# Patient Record
Sex: Female | Born: 1960 | Race: White | Hispanic: No | Marital: Married | Smoking: Never smoker
Health system: Southern US, Community
[De-identification: ages and names within clinical notes are randomized; demographics above are authoritative.]

## PROBLEM LIST (undated history)

## (undated) DIAGNOSIS — K610 Anal abscess: Secondary | ICD-10-CM

## (undated) DIAGNOSIS — K5909 Other constipation: Secondary | ICD-10-CM

## (undated) DIAGNOSIS — G43909 Migraine, unspecified, not intractable, without status migrainosus: Secondary | ICD-10-CM

## (undated) DIAGNOSIS — K649 Unspecified hemorrhoids: Secondary | ICD-10-CM

## (undated) DIAGNOSIS — Z973 Presence of spectacles and contact lenses: Secondary | ICD-10-CM

## (undated) DIAGNOSIS — Z8489 Family history of other specified conditions: Secondary | ICD-10-CM

## (undated) DIAGNOSIS — R51 Headache: Secondary | ICD-10-CM

## (undated) DIAGNOSIS — A6920 Lyme disease, unspecified: Secondary | ICD-10-CM

## (undated) HISTORY — PX: TONSILLECTOMY: SUR1361

## (undated) HISTORY — DX: Headache: R51

## (undated) HISTORY — PX: APPENDECTOMY: SHX54

## (undated) HISTORY — DX: Lyme disease, unspecified: A69.20

## (undated) HISTORY — PX: BREAST BIOPSY: SHX20

---

## 1988-01-30 HISTORY — PX: APPENDECTOMY: SHX54

## 2004-01-30 DIAGNOSIS — A6920 Lyme disease, unspecified: Secondary | ICD-10-CM

## 2004-01-30 DIAGNOSIS — Z8619 Personal history of other infectious and parasitic diseases: Secondary | ICD-10-CM

## 2004-01-30 HISTORY — DX: Lyme disease, unspecified: A69.20

## 2004-01-30 HISTORY — DX: Personal history of other infectious and parasitic diseases: Z86.19

## 2004-12-25 ENCOUNTER — Ambulatory Visit: Payer: Self-pay | Admitting: Family Medicine

## 2005-02-27 ENCOUNTER — Other Ambulatory Visit: Admission: RE | Admit: 2005-02-27 | Discharge: 2005-02-27 | Payer: Self-pay | Admitting: Obstetrics and Gynecology

## 2007-08-08 ENCOUNTER — Encounter (INDEPENDENT_AMBULATORY_CARE_PROVIDER_SITE_OTHER): Payer: Self-pay | Admitting: *Deleted

## 2007-08-08 HISTORY — PX: COLONOSCOPY: SHX174

## 2009-01-11 ENCOUNTER — Ambulatory Visit: Payer: Self-pay | Admitting: Family Medicine

## 2009-01-11 DIAGNOSIS — J019 Acute sinusitis, unspecified: Secondary | ICD-10-CM

## 2009-01-11 DIAGNOSIS — R519 Headache, unspecified: Secondary | ICD-10-CM | POA: Insufficient documentation

## 2009-01-11 DIAGNOSIS — R51 Headache: Secondary | ICD-10-CM

## 2009-01-11 DIAGNOSIS — H6690 Otitis media, unspecified, unspecified ear: Secondary | ICD-10-CM | POA: Insufficient documentation

## 2009-04-08 ENCOUNTER — Encounter: Admission: RE | Admit: 2009-04-08 | Discharge: 2009-04-08 | Payer: Self-pay | Admitting: Obstetrics and Gynecology

## 2009-04-15 ENCOUNTER — Encounter: Admission: RE | Admit: 2009-04-15 | Discharge: 2009-04-15 | Payer: Self-pay | Admitting: Obstetrics and Gynecology

## 2010-04-11 ENCOUNTER — Other Ambulatory Visit: Payer: Self-pay | Admitting: Obstetrics and Gynecology

## 2010-04-11 DIAGNOSIS — R928 Other abnormal and inconclusive findings on diagnostic imaging of breast: Secondary | ICD-10-CM

## 2010-04-14 ENCOUNTER — Ambulatory Visit
Admission: RE | Admit: 2010-04-14 | Discharge: 2010-04-14 | Disposition: A | Discharge status: Home / Self Care | Payer: BC Managed Care – PPO | Source: Ambulatory Visit | Attending: Obstetrics and Gynecology | Admitting: Obstetrics and Gynecology

## 2010-04-14 DIAGNOSIS — R928 Other abnormal and inconclusive findings on diagnostic imaging of breast: Secondary | ICD-10-CM

## 2010-04-18 ENCOUNTER — Encounter: Payer: Self-pay | Admitting: Family Medicine

## 2010-04-24 ENCOUNTER — Other Ambulatory Visit (INDEPENDENT_AMBULATORY_CARE_PROVIDER_SITE_OTHER): Payer: BC Managed Care – PPO | Admitting: Family Medicine

## 2010-04-24 DIAGNOSIS — Z0000 Encounter for general adult medical examination without abnormal findings: Secondary | ICD-10-CM

## 2010-04-24 LAB — CBC WITH DIFFERENTIAL/PLATELET
Basophils Absolute: 0 10*3/uL (ref 0.0–0.1)
Eosinophils Absolute: 0.1 10*3/uL (ref 0.0–0.7)
Eosinophils Relative: 1 % (ref 0.0–5.0)
HCT: 37.9 % (ref 36.0–46.0)
Hemoglobin: 12.9 g/dL (ref 12.0–15.0)
Lymphs Abs: 0.9 10*3/uL (ref 0.7–4.0)
MCV: 87.1 fl (ref 78.0–100.0)
Monocytes Absolute: 0.3 10*3/uL (ref 0.1–1.0)
Monocytes Relative: 6.7 % (ref 3.0–12.0)
Neutro Abs: 3.7 10*3/uL (ref 1.4–7.7)
Platelets: 107 10*3/uL — ABNORMAL LOW (ref 150.0–400.0)
RBC: 4.36 Mil/uL (ref 3.87–5.11)
RDW: 14.7 % — ABNORMAL HIGH (ref 11.5–14.6)

## 2010-04-24 LAB — LIPID PANEL
Cholesterol: 197 mg/dL (ref 0–200)
LDL Cholesterol: 99 mg/dL (ref 0–99)
VLDL: 10.4 mg/dL (ref 0.0–40.0)

## 2010-04-24 LAB — BASIC METABOLIC PANEL
CO2: 26 mEq/L (ref 19–32)
Calcium: 8.7 mg/dL (ref 8.4–10.5)
Chloride: 108 mEq/L (ref 96–112)
Creatinine, Ser: 0.9 mg/dL (ref 0.4–1.2)
Sodium: 138 mEq/L (ref 135–145)

## 2010-04-24 LAB — POCT URINALYSIS DIPSTICK
Glucose, UA: NEGATIVE
Protein, UA: NEGATIVE

## 2010-04-24 LAB — HEPATIC FUNCTION PANEL
Albumin: 4 g/dL (ref 3.5–5.2)
Alkaline Phosphatase: 54 U/L (ref 39–117)
Total Protein: 6.2 g/dL (ref 6.0–8.3)

## 2010-04-25 ENCOUNTER — Telehealth: Payer: Self-pay

## 2010-04-25 NOTE — Telephone Encounter (Signed)
Pt aware,

## 2010-04-25 NOTE — Telephone Encounter (Signed)
Message copied by Madison Hickman on Tue Apr 25, 2010 11:20 AM ------      Message from: Dwaine Deter      Created: Tue Apr 25, 2010  8:32 AM       Normal except for mildly low platelets. We will discuss at the cpx

## 2010-04-25 NOTE — Telephone Encounter (Signed)
Message copied by Madison Hickman on Tue Apr 25, 2010  3:29 PM ------      Message from: Dwaine Deter      Created: Mon Apr 24, 2010 12:36 PM       She has blood in the urine. Ask her if she was on menses then

## 2010-04-25 NOTE — Telephone Encounter (Signed)
Yes pt stated was on menses.

## 2010-05-15 ENCOUNTER — Encounter: Payer: Self-pay | Admitting: Family Medicine

## 2010-05-15 ENCOUNTER — Ambulatory Visit (INDEPENDENT_AMBULATORY_CARE_PROVIDER_SITE_OTHER): Payer: BC Managed Care – PPO | Admitting: Family Medicine

## 2010-05-15 VITALS — BP 120/80 | HR 100 | Ht 68.0 in | Wt 150.0 lb

## 2010-05-15 DIAGNOSIS — Z0000 Encounter for general adult medical examination without abnormal findings: Secondary | ICD-10-CM

## 2010-05-15 NOTE — Progress Notes (Signed)
  Subjective:    Patient ID: Jordan Olson, female    DOB: 02/06/1960, 50 y.o.   MRN: [id number]  HPI 50 yr old female for a cpx. She is doing well except for some occasional pain in the hands and some mild hearing loss. It is hard for her to hear conversations in a crowded room. Advil helps the pain well.    Review of Systems  Constitutional: Negative.   HENT: Negative.   Eyes: Negative.   Respiratory: Negative.   Cardiovascular: Negative.   Gastrointestinal: Negative.   Genitourinary: Negative for dysuria, urgency, frequency, hematuria, flank pain, decreased urine volume, enuresis, difficulty urinating, pelvic pain and dyspareunia.  Musculoskeletal: Negative.   Skin: Negative.   Neurological: Negative.   Hematological: Negative.   Psychiatric/Behavioral: Negative.        Objective:   Physical Exam  Constitutional: She is oriented to person, place, and time. She appears well-developed and well-nourished. No distress.  HENT:  Head: Normocephalic and atraumatic.  Right Ear: External ear normal.  Left Ear: External ear normal.  Nose: Nose normal.  Mouth/Throat: Oropharynx is clear and moist. No oropharyngeal exudate.  Eyes: Conjunctivae and EOM are normal. Pupils are equal, round, and reactive to light. No scleral icterus.  Neck: Normal range of motion. Neck supple. No JVD present. No thyromegaly present.  Cardiovascular: Normal rate, regular rhythm, normal heart sounds and intact distal pulses.  Exam reveals no gallop and no friction rub.   No murmur heard. Pulmonary/Chest: Effort normal and breath sounds normal. No respiratory distress. She has no wheezes. She has no rales. She exhibits no tenderness.  Abdominal: Soft. Bowel sounds are normal. She exhibits no distension and no mass. There is no tenderness. There is no rebound and no guarding.  Musculoskeletal: Normal range of motion. She exhibits no edema and no tenderness.  Lymphadenopathy:    She has no cervical adenopathy.    Neurological: She is alert and oriented to person, place, and time. She has normal reflexes. No cranial nerve deficit. She exhibits normal muscle tone. Coordination normal.  Skin: Skin is warm and dry. No rash noted. No erythema.  Psychiatric: She has a normal mood and affect. Her behavior is normal. Judgment and thought content normal.          Assessment & Plan:  Recheck a CBC in 90 days. Refer for audiologic evaluation.

## 2010-07-17 ENCOUNTER — Other Ambulatory Visit: Payer: Self-pay | Admitting: *Deleted

## 2010-07-20 ENCOUNTER — Other Ambulatory Visit (INDEPENDENT_AMBULATORY_CARE_PROVIDER_SITE_OTHER): Payer: BC Managed Care – PPO

## 2010-07-20 DIAGNOSIS — D696 Thrombocytopenia, unspecified: Secondary | ICD-10-CM

## 2010-07-20 LAB — CBC WITH DIFFERENTIAL/PLATELET
Basophils Absolute: 0 10*3/uL (ref 0.0–0.1)
HCT: 35.4 % — ABNORMAL LOW (ref 36.0–46.0)
Lymphocytes Relative: 18.2 % (ref 12.0–46.0)
MCV: 87.9 fl (ref 78.0–100.0)
Monocytes Relative: 8.1 % (ref 3.0–12.0)
Neutrophils Relative %: 72.9 % (ref 43.0–77.0)
Platelets: 123 10*3/uL — ABNORMAL LOW (ref 150.0–400.0)
RDW: 14.4 % (ref 11.5–14.6)

## 2010-07-21 ENCOUNTER — Encounter: Payer: Self-pay | Admitting: *Deleted

## 2011-02-01 ENCOUNTER — Encounter: Payer: Self-pay | Admitting: Family

## 2011-02-01 ENCOUNTER — Ambulatory Visit (INDEPENDENT_AMBULATORY_CARE_PROVIDER_SITE_OTHER): Payer: BC Managed Care – PPO | Admitting: Family

## 2011-02-01 VITALS — BP 140/80 | Temp 97.9°F | Wt 149.0 lb

## 2011-02-01 DIAGNOSIS — J019 Acute sinusitis, unspecified: Secondary | ICD-10-CM

## 2011-02-01 MED ORDER — AMOXICILLIN-POT CLAVULANATE 875-125 MG PO TABS: 1.0000 | ORAL_TABLET | Freq: Two times a day (BID) | ORAL | Status: AC

## 2011-02-01 NOTE — Patient Instructions (Signed)

## 2011-02-01 NOTE — Progress Notes (Signed)
  Subjective:    Patient ID: Jordan Olson, female    DOB: 04/28/1960, 51 y.o.   MRN: [id number]  HPI 51 year old white female, nonsmoker, patient of Dr. Clent Ridges is in today with a ten-day history of sinus pressure, and pain headache sneezing, cough, and congestion. She's been taking over-the-counter Sudafed he is in a humidifier but has not helped. 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  Her symptoms are worsening.    Review of Systems  Constitutional: Positive for fever and fatigue.  HENT: Positive for congestion, postnasal drip and sinus pressure.   Respiratory: Positive for cough.   Cardiovascular: Negative.   Gastrointestinal: Negative.   Genitourinary: Negative.   Musculoskeletal: Negative.   Neurological: Negative.   Hematological: Negative.   Psychiatric/Behavioral: Negative.    Past Medical History  Diagnosis Date  . Headache   . Lyme disease 2006    History   Social History  . Marital Status: Single    Spouse Name: N/A    Number of Children: N/A  . Years of Education: N/A   Occupational History  . Not on file.   Social History Main Topics  . Smoking status: Never Smoker   . Smokeless tobacco: Not on file  . Alcohol Use: Not on file  . Drug Use: Not on file  . Sexually Active: Not on file   Other Topics Concern  . Not on file   Social History Narrative  . No narrative on file    Past Surgical History  Procedure Date  . Appendectomy   . Tonsillectomy     Family History  Problem Relation Age of Onset  . Colon cancer    . Diabetes      No Known Allergies  Current Outpatient Prescriptions on File Prior to Visit  Medication Sig Dispense Refill  . butalbital-acetaminophen-caffeine (FIORICET WITH CODEINE) 50-325-40-30 MG per capsule Take 1 capsule by mouth every 4 (four) hours as needed.        . Multiple Vitamin (MULTIVITAMIN) tablet Take 1 tablet by mouth daily.          BP 140/80  Temp(Src) 97.9 F (36.6 C) (Oral)  Wt 149 lb  (67.586 kg)chart   Objective:   Physical Exam  Constitutional: She is oriented to person, place, and time. She appears well-developed and well-nourished.  HENT:  Right Ear: External ear normal.  Left Ear: External ear normal.  Nose: Nose normal.  Mouth/Throat: Oropharynx is clear and moist.       Moderate amount of fluid in the ears bilaterally. Maxillary and frontal sinus tenderness to palpation.   Neck: Normal range of motion. Neck supple.  Cardiovascular: Normal rate, regular rhythm and normal heart sounds.   Pulmonary/Chest: Effort normal and breath sounds normal.  Musculoskeletal: Normal range of motion.  Neurological: She is alert and oriented to person, place, and time.  Skin: Skin is warm and dry.  Psychiatric: She has a normal mood and affect.          Assessment & Plan:  Assessment: Acute sinusitis  Plan: Augmentin 875 one by mouth twice a day x10 days. Sudafed when necessary. Rest. Drink clear fluids. Patient call if symptoms worsen or persist, recheck as scheduled and when necessary.

## 2012-07-03 ENCOUNTER — Other Ambulatory Visit: Payer: Self-pay | Admitting: Obstetrics and Gynecology

## 2012-07-03 DIAGNOSIS — R928 Other abnormal and inconclusive findings on diagnostic imaging of breast: Secondary | ICD-10-CM

## 2012-07-11 ENCOUNTER — Other Ambulatory Visit: Payer: Self-pay | Admitting: Obstetrics and Gynecology

## 2012-07-11 ENCOUNTER — Ambulatory Visit
Admission: RE | Admit: 2012-07-11 | Discharge: 2012-07-11 | Disposition: A | Discharge status: Home / Self Care | Payer: BC Managed Care – PPO | Source: Ambulatory Visit | Attending: Obstetrics and Gynecology | Admitting: Obstetrics and Gynecology

## 2012-07-11 DIAGNOSIS — R928 Other abnormal and inconclusive findings on diagnostic imaging of breast: Secondary | ICD-10-CM

## 2012-07-17 ENCOUNTER — Ambulatory Visit
Admission: RE | Admit: 2012-07-17 | Discharge: 2012-07-17 | Disposition: A | Discharge status: Home / Self Care | Payer: BC Managed Care – PPO | Source: Ambulatory Visit | Attending: Obstetrics and Gynecology | Admitting: Obstetrics and Gynecology

## 2012-07-17 DIAGNOSIS — R928 Other abnormal and inconclusive findings on diagnostic imaging of breast: Secondary | ICD-10-CM

## 2012-10-20 ENCOUNTER — Other Ambulatory Visit: Payer: Self-pay | Admitting: Obstetrics and Gynecology

## 2012-10-20 DIAGNOSIS — N6320 Unspecified lump in the left breast, unspecified quadrant: Secondary | ICD-10-CM

## 2012-10-20 DIAGNOSIS — N6001 Solitary cyst of right breast: Secondary | ICD-10-CM

## 2012-11-03 ENCOUNTER — Ambulatory Visit
Admission: RE | Admit: 2012-11-03 | Discharge: 2012-11-03 | Disposition: A | Discharge status: Home / Self Care | Payer: BC Managed Care – PPO | Source: Ambulatory Visit | Attending: Obstetrics and Gynecology | Admitting: Obstetrics and Gynecology

## 2012-11-03 DIAGNOSIS — N6001 Solitary cyst of right breast: Secondary | ICD-10-CM

## 2012-11-03 DIAGNOSIS — N6320 Unspecified lump in the left breast, unspecified quadrant: Secondary | ICD-10-CM

## 2012-11-10 ENCOUNTER — Encounter: Payer: Self-pay | Admitting: Family Medicine

## 2012-11-10 ENCOUNTER — Ambulatory Visit (INDEPENDENT_AMBULATORY_CARE_PROVIDER_SITE_OTHER): Payer: BC Managed Care – PPO | Admitting: Family Medicine

## 2012-11-10 VITALS — BP 120/70 | Temp 98.0°F | Wt 145.0 lb

## 2012-11-10 DIAGNOSIS — R51 Headache: Secondary | ICD-10-CM

## 2012-11-10 DIAGNOSIS — D696 Thrombocytopenia, unspecified: Secondary | ICD-10-CM | POA: Insufficient documentation

## 2012-11-10 MED ORDER — BUTALBITAL-APAP-CAFF-COD 50-325-40-30 MG PO CAPS: 1.0000 | ORAL_CAPSULE | ORAL | Status: DC | PRN

## 2012-11-10 NOTE — Progress Notes (Signed)
  Subjective:    Patient ID: Jordan Olson, female    DOB: 04-Oct-1960, 52 y.o.   MRN: [id number]  HPI Here to follow up on HAs. These are stable and she averages only 3 or 4 a year. Fioricet still work well for her, otherwise she is doing well. She also needs to recheck her platelets which have been stable but mildly low in the 120K range. No reports of easy bruising or bleeding.    Review of Systems  Constitutional: Negative.   Respiratory: Negative.   Cardiovascular: Negative.   Neurological: Negative.   Hematological: Negative.        Objective:   Physical Exam  Constitutional: She is oriented to person, place, and time. She appears well-developed and well-nourished.  Cardiovascular: Normal rate, regular rhythm, normal heart sounds and intact distal pulses.   Pulmonary/Chest: Effort normal and breath sounds normal.  Neurological: She is alert and oriented to person, place, and time.          Assessment & Plan:  Refilled Fioricet. Get a CBC

## 2012-11-11 LAB — CBC WITH DIFFERENTIAL/PLATELET
Eosinophils Absolute: 0 10*3/uL (ref 0.0–0.7)
HCT: 37.5 % (ref 36.0–46.0)
Hemoglobin: 12.6 g/dL (ref 12.0–15.0)
Monocytes Absolute: 0.4 10*3/uL (ref 0.1–1.0)
Neutrophils Relative %: 71.2 % (ref 43.0–77.0)
Platelets: 124 10*3/uL — ABNORMAL LOW (ref 150.0–400.0)
WBC: 5.3 10*3/uL (ref 4.5–10.5)

## 2012-11-29 HISTORY — PX: BLEPHAROPLASTY W/ LASER: SHX1243

## 2013-02-24 ENCOUNTER — Other Ambulatory Visit: Payer: Self-pay | Admitting: Obstetrics and Gynecology

## 2013-02-24 DIAGNOSIS — N63 Unspecified lump in unspecified breast: Secondary | ICD-10-CM

## 2013-03-06 ENCOUNTER — Other Ambulatory Visit: Payer: BC Managed Care – PPO

## 2013-03-09 ENCOUNTER — Ambulatory Visit
Admission: RE | Admit: 2013-03-09 | Discharge: 2013-03-09 | Disposition: A | Discharge status: Home / Self Care | Payer: BC Managed Care – PPO | Source: Ambulatory Visit | Attending: Obstetrics and Gynecology | Admitting: Obstetrics and Gynecology

## 2013-03-09 ENCOUNTER — Ambulatory Visit
Admission: RE | Admit: 2013-03-09 | Discharge: 2013-03-09 | Disposition: A | Discharge status: Home / Self Care | Payer: Self-pay | Source: Ambulatory Visit | Attending: Obstetrics and Gynecology | Admitting: Obstetrics and Gynecology

## 2013-03-09 DIAGNOSIS — N63 Unspecified lump in unspecified breast: Secondary | ICD-10-CM

## 2013-04-07 ENCOUNTER — Encounter: Payer: Self-pay | Admitting: *Deleted

## 2013-04-08 ENCOUNTER — Other Ambulatory Visit (INDEPENDENT_AMBULATORY_CARE_PROVIDER_SITE_OTHER): Payer: BC Managed Care – PPO

## 2013-04-08 DIAGNOSIS — Z0000 Encounter for general adult medical examination without abnormal findings: Secondary | ICD-10-CM

## 2013-04-08 LAB — TSH: TSH: 1.55 u[IU]/mL (ref 0.35–5.50)

## 2013-04-08 LAB — CBC WITH DIFFERENTIAL/PLATELET
Basophils Absolute: 0 10*3/uL (ref 0.0–0.1)
Basophils Relative: 0.5 % (ref 0.0–3.0)
EOS ABS: 0 10*3/uL (ref 0.0–0.7)
EOS PCT: 0.3 % (ref 0.0–5.0)
HCT: 36.1 % (ref 36.0–46.0)
HEMOGLOBIN: 12 g/dL (ref 12.0–15.0)
LYMPHS PCT: 20.7 % (ref 12.0–46.0)
Lymphs Abs: 0.8 10*3/uL (ref 0.7–4.0)
MCHC: 33.1 g/dL (ref 30.0–36.0)
MCV: 87.7 fl (ref 78.0–100.0)
MONO ABS: 0.3 10*3/uL (ref 0.1–1.0)
Monocytes Relative: 7.8 % (ref 3.0–12.0)
NEUTROS PCT: 70.7 % (ref 43.0–77.0)
Neutro Abs: 2.9 10*3/uL (ref 1.4–7.7)
Platelets: 127 10*3/uL — ABNORMAL LOW (ref 150.0–400.0)
RBC: 4.12 Mil/uL (ref 3.87–5.11)
RDW: 13.9 % (ref 11.5–14.6)
WBC: 4.1 10*3/uL — ABNORMAL LOW (ref 4.5–10.5)

## 2013-04-08 LAB — POCT URINALYSIS DIPSTICK
Bilirubin, UA: NEGATIVE
Glucose, UA: NEGATIVE
Ketones, UA: NEGATIVE
NITRITE UA: NEGATIVE
PROTEIN UA: NEGATIVE
RBC UA: NEGATIVE
SPEC GRAV UA: 1.01
UROBILINOGEN UA: 0.2
pH, UA: 7.5

## 2013-04-08 LAB — BASIC METABOLIC PANEL
BUN: 10 mg/dL (ref 6–23)
CALCIUM: 9.3 mg/dL (ref 8.4–10.5)
CO2: 29 meq/L (ref 19–32)
Chloride: 103 mEq/L (ref 96–112)
Creatinine, Ser: 0.8 mg/dL (ref 0.4–1.2)
GFR: 83.64 mL/min (ref >60.00)
GLUCOSE: 86 mg/dL (ref 70–99)
Potassium: 3.9 mEq/L (ref 3.5–5.1)
SODIUM: 137 meq/L (ref 135–145)

## 2013-04-08 LAB — LIPID PANEL
CHOLESTEROL: 196 mg/dL (ref 0–200)
HDL: 92.1 mg/dL (ref >39.00)
LDL Cholesterol: 94 mg/dL (ref 0–99)
Total CHOL/HDL Ratio: 2
Triglycerides: 51 mg/dL (ref 0.0–149.0)
VLDL: 10.2 mg/dL (ref 0.0–40.0)

## 2013-04-08 LAB — HEPATIC FUNCTION PANEL
ALBUMIN: 4.4 g/dL (ref 3.5–5.2)
ALK PHOS: 58 U/L (ref 39–117)
ALT: 41 U/L — ABNORMAL HIGH (ref 0–35)
AST: 28 U/L (ref 0–37)
Bilirubin, Direct: 0.1 mg/dL (ref 0.0–0.3)
TOTAL PROTEIN: 6.5 g/dL (ref 6.0–8.3)
Total Bilirubin: 0.9 mg/dL (ref 0.3–1.2)

## 2013-04-15 ENCOUNTER — Encounter: Payer: BC Managed Care – PPO | Admitting: Family Medicine

## 2013-04-27 ENCOUNTER — Encounter: Payer: BC Managed Care – PPO | Admitting: Family Medicine

## 2013-05-05 ENCOUNTER — Encounter: Payer: Self-pay | Admitting: Family Medicine

## 2013-05-13 ENCOUNTER — Ambulatory Visit (INDEPENDENT_AMBULATORY_CARE_PROVIDER_SITE_OTHER): Payer: BC Managed Care – PPO | Admitting: Family Medicine

## 2013-05-13 ENCOUNTER — Encounter: Payer: Self-pay | Admitting: Family Medicine

## 2013-05-13 VITALS — BP 116/82 | HR 86 | Temp 97.9°F | Ht 68.5 in | Wt 146.0 lb

## 2013-05-13 DIAGNOSIS — D709 Neutropenia, unspecified: Secondary | ICD-10-CM

## 2013-05-13 DIAGNOSIS — D696 Thrombocytopenia, unspecified: Secondary | ICD-10-CM

## 2013-05-13 DIAGNOSIS — Z0000 Encounter for general adult medical examination without abnormal findings: Secondary | ICD-10-CM

## 2013-05-13 NOTE — Progress Notes (Signed)
Pre visit review using our clinic review tool, if applicable. No additional management support is needed unless otherwise documented below in the visit note. 

## 2013-05-13 NOTE — Progress Notes (Signed)
   Subjective:    Patient ID: Jordan Olson, female    DOB: 01-May-1960, 53 y.o.   MRN: [id number]  HPI 53 yr old female for a cpx. She feels great. She has completely eliminated caffeine from her diet, and her headaches have stopped.    Review of Systems  Constitutional: Negative.   HENT: Negative.   Eyes: Negative.   Respiratory: Negative.   Cardiovascular: Negative.   Gastrointestinal: Negative.   Genitourinary: Negative for dysuria, urgency, frequency, hematuria, flank pain, decreased urine volume, enuresis, difficulty urinating, pelvic pain and dyspareunia.  Musculoskeletal: Negative.   Skin: Negative.   Neurological: Negative.   Psychiatric/Behavioral: Negative.        Objective:   Physical Exam  Constitutional: She is oriented to person, place, and time. She appears well-developed and well-nourished. No distress.  HENT:  Head: Normocephalic and atraumatic.  Right Ear: External ear normal.  Left Ear: External ear normal.  Nose: Nose normal.  Mouth/Throat: Oropharynx is clear and moist. No oropharyngeal exudate.  Eyes: Conjunctivae and EOM are normal. Pupils are equal, round, and reactive to light. No scleral icterus.  Neck: Normal range of motion. Neck supple. No JVD present. No thyromegaly present.  Cardiovascular: Normal rate, regular rhythm, normal heart sounds and intact distal pulses.  Exam reveals no gallop and no friction rub.   No murmur heard. EKG normal   Pulmonary/Chest: Effort normal and breath sounds normal. No respiratory distress. She has no wheezes. She has no rales. She exhibits no tenderness.  Abdominal: Soft. Bowel sounds are normal. She exhibits no distension and no mass. There is no tenderness. There is no rebound and no guarding.  Musculoskeletal: Normal range of motion. She exhibits no edema and no tenderness.  Lymphadenopathy:    She has no cervical adenopathy.  Neurological: She is alert and oriented to person, place, and time. She has normal  reflexes. No cranial nerve deficit. She exhibits normal muscle tone. Coordination normal.  Skin: Skin is warm and dry. No rash noted. No erythema.  Psychiatric: She has a normal mood and affect. Her behavior is normal. Judgment and thought content normal.          Assessment & Plan:  Well exam. We have been following her mild thrombocytopenia for several years and her counts have been stable in the 120 K range. However now she has a slight drop in her WBC to 4.1, and she is concerned. We will refer her to Hematology to evaluate.

## 2013-09-01 ENCOUNTER — Other Ambulatory Visit: Payer: Self-pay | Admitting: Obstetrics and Gynecology

## 2013-09-03 LAB — CYTOLOGY - PAP

## 2013-12-15 ENCOUNTER — Other Ambulatory Visit: Payer: Self-pay | Admitting: Obstetrics and Gynecology

## 2013-12-16 LAB — CYTOLOGY - PAP

## 2014-04-28 ENCOUNTER — Telehealth: Payer: Self-pay | Admitting: Family Medicine

## 2014-04-28 ENCOUNTER — Other Ambulatory Visit: Payer: Self-pay | Admitting: Family Medicine

## 2014-04-28 DIAGNOSIS — Z0000 Encounter for general adult medical examination without abnormal findings: Secondary | ICD-10-CM

## 2014-04-28 NOTE — Telephone Encounter (Signed)
Patient would like to have CPX labs drawn at [facility].  Her CPX is schedule on 05/19/14, can you please enter the orders? Thank you.

## 2014-04-28 NOTE — Telephone Encounter (Signed)
I put future lab order in computer and spoke with pt.  

## 2014-05-14 ENCOUNTER — Other Ambulatory Visit (INDEPENDENT_AMBULATORY_CARE_PROVIDER_SITE_OTHER): Payer: 59

## 2014-05-14 DIAGNOSIS — Z0000 Encounter for general adult medical examination without abnormal findings: Secondary | ICD-10-CM

## 2014-05-14 LAB — LIPID PANEL
Cholesterol: 217 mg/dL — ABNORMAL HIGH (ref 0–200)
HDL: 88.2 mg/dL (ref >39.00)
LDL Cholesterol: 118 mg/dL — ABNORMAL HIGH (ref 0–99)
NonHDL: 128.8
TRIGLYCERIDES: 52 mg/dL (ref 0.0–149.0)
Total CHOL/HDL Ratio: 2
VLDL: 10.4 mg/dL (ref 0.0–40.0)

## 2014-05-14 LAB — URINALYSIS, ROUTINE W REFLEX MICROSCOPIC
BILIRUBIN URINE: NEGATIVE
KETONES UR: NEGATIVE
Leukocytes, UA: NEGATIVE
NITRITE: NEGATIVE
SPECIFIC GRAVITY, URINE: 1.025 (ref 1.000–1.030)
TOTAL PROTEIN, URINE-UPE24: NEGATIVE
Urine Glucose: NEGATIVE
Urobilinogen, UA: 0.2 (ref 0.0–1.0)
WBC, UA: NONE SEEN (ref 0–?)
pH: 6 (ref 5.0–8.0)

## 2014-05-14 LAB — CBC WITH DIFFERENTIAL/PLATELET
Basophils Absolute: 0 10*3/uL (ref 0.0–0.1)
Basophils Relative: 0.9 % (ref 0.0–3.0)
EOS PCT: 1 % (ref 0.0–5.0)
Eosinophils Absolute: 0 10*3/uL (ref 0.0–0.7)
HCT: 41.5 % (ref 36.0–46.0)
Hemoglobin: 14 g/dL (ref 12.0–15.0)
LYMPHS PCT: 26.6 % (ref 12.0–46.0)
Lymphs Abs: 1 10*3/uL (ref 0.7–4.0)
MCHC: 33.8 g/dL (ref 30.0–36.0)
MCV: 85.6 fl (ref 78.0–100.0)
MONO ABS: 0.3 10*3/uL (ref 0.1–1.0)
MONOS PCT: 8.1 % (ref 3.0–12.0)
NEUTROS PCT: 63.4 % (ref 43.0–77.0)
Neutro Abs: 2.3 10*3/uL (ref 1.4–7.7)
Platelets: 123 10*3/uL — ABNORMAL LOW (ref 150.0–400.0)
RBC: 4.85 Mil/uL (ref 3.87–5.11)
RDW: 13.4 % (ref 11.5–15.5)
WBC: 3.6 10*3/uL — ABNORMAL LOW (ref 4.0–10.5)

## 2014-05-14 LAB — HEPATIC FUNCTION PANEL
ALBUMIN: 4.3 g/dL (ref 3.5–5.2)
ALT: 14 U/L (ref 0–35)
AST: 14 U/L (ref 0–37)
Alkaline Phosphatase: 85 U/L (ref 39–117)
BILIRUBIN DIRECT: 0.1 mg/dL (ref 0.0–0.3)
Total Bilirubin: 0.6 mg/dL (ref 0.2–1.2)
Total Protein: 6.7 g/dL (ref 6.0–8.3)

## 2014-05-14 LAB — BASIC METABOLIC PANEL
BUN: 19 mg/dL (ref 6–23)
CO2: 30 mEq/L (ref 19–32)
Calcium: 9.4 mg/dL (ref 8.4–10.5)
Chloride: 104 mEq/L (ref 96–112)
Creatinine, Ser: 0.85 mg/dL (ref 0.40–1.20)
GFR: 74.31 mL/min (ref >60.00)
Glucose, Bld: 93 mg/dL (ref 70–99)
Potassium: 4.2 mEq/L (ref 3.5–5.1)
Sodium: 139 mEq/L (ref 135–145)

## 2014-05-14 LAB — TSH: TSH: 2.59 u[IU]/mL (ref 0.35–4.50)

## 2014-05-19 ENCOUNTER — Encounter: Payer: Self-pay | Admitting: Family Medicine

## 2014-05-19 ENCOUNTER — Ambulatory Visit (INDEPENDENT_AMBULATORY_CARE_PROVIDER_SITE_OTHER): Payer: 59 | Admitting: Family Medicine

## 2014-05-19 VITALS — BP 113/74 | HR 73 | Temp 98.7°F | Ht 68.5 in | Wt 142.0 lb

## 2014-05-19 DIAGNOSIS — Z0000 Encounter for general adult medical examination without abnormal findings: Secondary | ICD-10-CM | POA: Diagnosis not present

## 2014-05-19 DIAGNOSIS — Z23 Encounter for immunization: Secondary | ICD-10-CM | POA: Diagnosis not present

## 2014-05-19 NOTE — Progress Notes (Signed)
Pre visit review using our clinic review tool, if applicable. No additional management support is needed unless otherwise documented below in the visit note. 

## 2014-05-19 NOTE — Progress Notes (Signed)
   Subjective:    Patient ID: Jordan Olson, female    DOB: 1960/07/16, 54 y.o.   MRN: [id number]  HPI 54 yr old female for a cpx. She feels well.    Review of Systems  Constitutional: Negative.   HENT: Negative.   Eyes: Negative.   Respiratory: Negative.   Cardiovascular: Negative.   Gastrointestinal: Negative.   Genitourinary: Negative for dysuria, urgency, frequency, hematuria, flank pain, decreased urine volume, enuresis, difficulty urinating, pelvic pain and dyspareunia.  Musculoskeletal: Negative.   Skin: Negative.   Neurological: Negative.   Psychiatric/Behavioral: Negative.        Objective:   Physical Exam  Constitutional: She is oriented to person, place, and time. She appears well-developed and well-nourished. No distress.  HENT:  Head: Normocephalic and atraumatic.  Right Ear: External ear normal.  Left Ear: External ear normal.  Nose: Nose normal.  Mouth/Throat: Oropharynx is clear and moist. No oropharyngeal exudate.  Eyes: Conjunctivae and EOM are normal. Pupils are equal, round, and reactive to light. No scleral icterus.  Neck: Normal range of motion. Neck supple. No JVD present. No thyromegaly present.  Cardiovascular: Normal rate, regular rhythm, normal heart sounds and intact distal pulses.  Exam reveals no gallop and no friction rub.   No murmur heard. Pulmonary/Chest: Effort normal and breath sounds normal. No respiratory distress. She has no wheezes. She has no rales. She exhibits no tenderness.  Abdominal: Soft. Bowel sounds are normal. She exhibits no distension and no mass. There is no tenderness. There is no rebound and no guarding.  Musculoskeletal: Normal range of motion. She exhibits no edema or tenderness.  Lymphadenopathy:    She has no cervical adenopathy.  Neurological: She is alert and oriented to person, place, and time. She has normal reflexes. No cranial nerve deficit. She exhibits normal muscle tone. Coordination normal.  Skin: Skin is  warm and dry. No rash noted. No erythema.  Psychiatric: She has a normal mood and affect. Her behavior is normal. Judgment and thought content normal.          Assessment & Plan:  Well exam.

## 2014-05-19 NOTE — Addendum Note (Signed)
Addended by: Aniceto BossNIMMONS, Reynaldo Rossman A on: 05/19/2014 02:56 PM   Modules accepted: Orders

## 2015-02-08 ENCOUNTER — Telehealth: Payer: Self-pay | Admitting: Family Medicine

## 2015-02-08 DIAGNOSIS — Z0000 Encounter for general adult medical examination without abnormal findings: Secondary | ICD-10-CM

## 2015-02-08 NOTE — Telephone Encounter (Signed)
Pt has cpx sch for 05-25-15 and would like to go to [facility] for cpx labs around 05-18-15. Please put order in system

## 2015-02-09 NOTE — Telephone Encounter (Signed)
Labs was place on hold for future labs at [facility]

## 2015-05-11 ENCOUNTER — Other Ambulatory Visit (INDEPENDENT_AMBULATORY_CARE_PROVIDER_SITE_OTHER): Payer: 59

## 2015-05-11 DIAGNOSIS — Z0000 Encounter for general adult medical examination without abnormal findings: Secondary | ICD-10-CM | POA: Diagnosis not present

## 2015-05-11 LAB — URINALYSIS
Bilirubin Urine: NEGATIVE
HGB URINE DIPSTICK: NEGATIVE
Ketones, ur: NEGATIVE
Leukocytes, UA: NEGATIVE
NITRITE: NEGATIVE
PH: 6 (ref 5.0–8.0)
SPECIFIC GRAVITY, URINE: 1.02 (ref 1.000–1.030)
TOTAL PROTEIN, URINE-UPE24: NEGATIVE
URINE GLUCOSE: NEGATIVE
Urobilinogen, UA: 0.2 (ref 0.0–1.0)

## 2015-05-11 LAB — BASIC METABOLIC PANEL
BUN: 17 mg/dL (ref 6–23)
CALCIUM: 9.5 mg/dL (ref 8.4–10.5)
CO2: 31 mEq/L (ref 19–32)
CREATININE: 0.88 mg/dL (ref 0.40–1.20)
Chloride: 107 mEq/L (ref 96–112)
GFR: 71.13 mL/min (ref >60.00)
Glucose, Bld: 90 mg/dL (ref 70–99)
Potassium: 4.4 mEq/L (ref 3.5–5.1)
Sodium: 145 mEq/L (ref 135–145)

## 2015-05-11 LAB — CBC
HCT: 40.7 % (ref 36.0–46.0)
HEMOGLOBIN: 13.7 g/dL (ref 12.0–15.0)
MCHC: 33.7 g/dL (ref 30.0–36.0)
MCV: 87.1 fl (ref 78.0–100.0)
PLATELETS: 142 10*3/uL — AB (ref 150.0–400.0)
RBC: 4.68 Mil/uL (ref 3.87–5.11)
RDW: 14.4 % (ref 11.5–15.5)
WBC: 3.8 10*3/uL — ABNORMAL LOW (ref 4.0–10.5)

## 2015-05-11 LAB — LIPID PANEL
CHOLESTEROL: 203 mg/dL — AB (ref 0–200)
HDL: 87.3 mg/dL (ref >39.00)
LDL Cholesterol: 106 mg/dL — ABNORMAL HIGH (ref 0–99)
NONHDL: 115.71
Total CHOL/HDL Ratio: 2
Triglycerides: 47 mg/dL (ref 0.0–149.0)
VLDL: 9.4 mg/dL (ref 0.0–40.0)

## 2015-05-11 LAB — HEPATIC FUNCTION PANEL
ALK PHOS: 82 U/L (ref 39–117)
ALT: 20 U/L (ref 0–35)
AST: 20 U/L (ref 0–37)
Albumin: 4.4 g/dL (ref 3.5–5.2)
BILIRUBIN DIRECT: 0.1 mg/dL (ref 0.0–0.3)
BILIRUBIN TOTAL: 0.4 mg/dL (ref 0.2–1.2)
Total Protein: 6.5 g/dL (ref 6.0–8.3)

## 2015-05-11 LAB — TSH: TSH: 3.31 u[IU]/mL (ref 0.35–4.50)

## 2015-05-25 ENCOUNTER — Encounter: Payer: Self-pay | Admitting: Family Medicine

## 2015-05-25 ENCOUNTER — Ambulatory Visit (INDEPENDENT_AMBULATORY_CARE_PROVIDER_SITE_OTHER): Payer: 59 | Admitting: Family Medicine

## 2015-05-25 VITALS — BP 112/74 | HR 63 | Temp 98.2°F | Ht 67.75 in | Wt 132.0 lb

## 2015-05-25 DIAGNOSIS — Z0000 Encounter for general adult medical examination without abnormal findings: Secondary | ICD-10-CM | POA: Diagnosis not present

## 2015-05-25 MED ORDER — BUTALBITAL-APAP-CAFF-COD 50-325-40-30 MG PO CAPS: 1.0000 | ORAL_CAPSULE | ORAL | Status: DC | PRN

## 2015-05-25 NOTE — Progress Notes (Signed)
Pre visit review using our clinic review tool, if applicable. No additional management support is needed unless otherwise documented below in the visit note. 

## 2015-05-25 NOTE — Progress Notes (Signed)
   Subjective:    Patient ID: Jacinto ReapColleen Wright, female    DOB: 08/18/1960, 55 y.o.   MRN: [id number]  HPI 55 yr old female for a cpx. She feels well. She is doing yoga most every day.    Review of Systems  Constitutional: Negative.  Negative for fever, diaphoresis, activity change, appetite change, fatigue and unexpected weight change.  HENT: Negative.  Negative for congestion, ear pain, hearing loss, nosebleeds, sore throat, tinnitus, trouble swallowing and voice change.   Eyes: Negative.  Negative for photophobia, pain, discharge, redness and visual disturbance.  Respiratory: Negative.  Negative for apnea, cough, choking, chest tightness, shortness of breath, wheezing and stridor.   Cardiovascular: Negative.  Negative for chest pain, palpitations and leg swelling.  Gastrointestinal: Negative.  Negative for nausea, vomiting, abdominal pain, diarrhea, constipation, blood in stool, abdominal distention and rectal pain.  Genitourinary: Negative.  Negative for dysuria, urgency, frequency, hematuria, flank pain, decreased urine volume, vaginal bleeding, vaginal discharge, enuresis, difficulty urinating, vaginal pain, menstrual problem, pelvic pain and dyspareunia.  Musculoskeletal: Negative.  Negative for myalgias, back pain, joint swelling, arthralgias, gait problem, neck pain and neck stiffness.  Skin: Negative.  Negative for color change, pallor, rash and wound.  Neurological: Negative.  Negative for dizziness, tremors, seizures, syncope, speech difficulty, weakness, light-headedness, numbness and headaches.  Hematological: Negative for adenopathy. Does not bruise/bleed easily.  Psychiatric/Behavioral: Negative.  Negative for hallucinations, behavioral problems, confusion, sleep disturbance, dysphoric mood and agitation. The patient is not nervous/anxious.        Objective:   Physical Exam  Constitutional: She is oriented to person, place, and time. She appears well-developed and  well-nourished. No distress.  HENT:  Head: Normocephalic and atraumatic.  Right Ear: External ear normal.  Left Ear: External ear normal.  Nose: Nose normal.  Mouth/Throat: Oropharynx is clear and moist. No oropharyngeal exudate.  Eyes: Conjunctivae and EOM are normal. Pupils are equal, round, and reactive to light. No scleral icterus.  Neck: Normal range of motion. Neck supple. No JVD present. No thyromegaly present.  Cardiovascular: Normal rate, regular rhythm, normal heart sounds and intact distal pulses.  Exam reveals no gallop and no friction rub.   No murmur heard. EKG normal   Pulmonary/Chest: Effort normal and breath sounds normal. No respiratory distress. She has no wheezes. She has no rales. She exhibits no tenderness.  Abdominal: Soft. Bowel sounds are normal. She exhibits no distension and no mass. There is no tenderness. There is no rebound and no guarding.  Musculoskeletal: Normal range of motion. She exhibits no edema or tenderness.  Lymphadenopathy:    She has no cervical adenopathy.  Neurological: She is alert and oriented to person, place, and time. She has normal reflexes. No cranial nerve deficit. She exhibits normal muscle tone. Coordination normal.  Skin: Skin is warm and dry. No rash noted. No erythema.  Psychiatric: She has a normal mood and affect. Her behavior is normal. Judgment and thought content normal.          Assessment & Plan:  Well exam. We discussed diet and exercise.  Nelwyn SalisburyFRY,STEPHEN A, MD

## 2015-08-26 ENCOUNTER — Other Ambulatory Visit: Payer: Self-pay | Admitting: Obstetrics and Gynecology

## 2015-08-26 DIAGNOSIS — N6002 Solitary cyst of left breast: Secondary | ICD-10-CM

## 2015-08-30 ENCOUNTER — Ambulatory Visit
Admission: RE | Admit: 2015-08-30 | Discharge: 2015-08-30 | Disposition: A | Discharge status: Home / Self Care | Payer: 59 | Source: Ambulatory Visit | Attending: Obstetrics and Gynecology | Admitting: Obstetrics and Gynecology

## 2015-08-30 DIAGNOSIS — N6002 Solitary cyst of left breast: Secondary | ICD-10-CM

## 2015-11-25 ENCOUNTER — Ambulatory Visit (INDEPENDENT_AMBULATORY_CARE_PROVIDER_SITE_OTHER): Payer: 59 | Admitting: Family Medicine

## 2015-11-25 ENCOUNTER — Encounter: Payer: Self-pay | Admitting: Family Medicine

## 2015-11-25 VITALS — BP 102/80 | HR 75 | Temp 98.4°F | Ht 67.75 in | Wt 130.1 lb

## 2015-11-25 DIAGNOSIS — R300 Dysuria: Secondary | ICD-10-CM | POA: Diagnosis not present

## 2015-11-25 LAB — POCT URINALYSIS DIPSTICK
Bilirubin, UA: NEGATIVE
GLUCOSE UA: NEGATIVE
Ketones, UA: NEGATIVE
NITRITE UA: NEGATIVE
UROBILINOGEN UA: 0.2
pH, UA: 6.5

## 2015-11-25 MED ORDER — NITROFURANTOIN MONOHYD MACRO 100 MG PO CAPS: 100.0000 mg | ORAL_CAPSULE | Freq: Two times a day (BID) | ORAL | 0 refills | Status: DC

## 2015-11-25 NOTE — Progress Notes (Signed)
Pre visit review using our clinic review tool, if applicable. No additional management support is needed unless otherwise documented below in the visit note. 

## 2015-11-25 NOTE — Patient Instructions (Signed)
Please take the antibiotic as instructed.  Please follow up if symptoms are worsening or persist despite treatment.

## 2015-11-25 NOTE — Addendum Note (Signed)
Addended by: Bonnye FavaKWEI, NANA K on: 11/25/2015 03:07 PM   Modules accepted: Orders

## 2015-11-25 NOTE — Progress Notes (Signed)
  HPI:  Acute visit for:  Dysuria: -started 2 days ago -symptoms include dysuria, frequency, urgency -denies: fevers, malaise, flank pain, vaginal symptoms, hematuria, nausea, vomiting, abd or pelvic pain -hx UTI  ROS: See pertinent positives and negatives per HPI.  Past Medical History:  Diagnosis Date  . Headache(784.0)   . Lyme disease 2006    Past Surgical History:  Procedure Laterality Date  . APPENDECTOMY    . COLONOSCOPY  08-08-07   per Dr. Wandalee FerdinandSam Ganem, clear, repeat in 10 yrs   . TONSILLECTOMY      Family History  Problem Relation Age of Onset  . Colon cancer    . Diabetes      Social History   Social History  . Marital status: Single    Spouse name: N/A  . Number of children: N/A  . Years of education: N/A   Social History Main Topics  . Smoking status: Never Smoker  . Smokeless tobacco: Never Used  . Alcohol use 0.0 oz/week     Comment: glass of wine each night with dinner  . Drug use: No  . Sexual activity: Not Asked   Other Topics Concern  . None   Social History Narrative  . None     Current Outpatient Prescriptions:  .  butalbital-acetaminophen-caffeine (FIORICET WITH CODEINE) 50-325-40-30 MG capsule, Take 1 capsule by mouth every 4 (four) hours as needed for headache., Disp: 60 capsule, Rfl: 0 .  nitrofurantoin, macrocrystal-monohydrate, (MACROBID) 100 MG capsule, Take 1 capsule (100 mg total) by mouth 2 (two) times daily., Disp: 14 capsule, Rfl: 0  EXAM:  Vitals:   11/25/15 1438  BP: 102/80  Pulse: 75  Temp: 98.4 F (36.9 C)    Body mass index is 19.93 kg/m.  GENERAL: vitals reviewed and listed above, alert, oriented, appears well hydrated and in no acute distress  HEENT: atraumatic, conjunttiva clear, no obvious abnormalities on inspection of external nose and ears  NECK: no obvious masses on inspection  LUNGS: clear to auscultation bilaterally, no wheezes, rales or rhonchi, good air movement  CV: HRRR, no peripheral  edema  ABD: BS+, soft, NTTP, no CVA TTP  MS: moves all extremities without noticeable abnormality  PSYCH: pleasant and cooperative, no obvious depression or anxiety  ASSESSMENT AND PLAN:  Discussed the following assessment and plan:  Dysuria - Plan: POC Urinalysis Dipstick  -udip with blood and leuks, likely UTI  -opted to start abx and culture is pending, azo if needed -Patient advised to return or notify a doctor immediately if symptoms worsen or persist or new concerns arise.  Patient Instructions  Please take the antibiotic as instructed.  Please follow up if symptoms are worsening or persist despite treatment.   Kriste BasqueKIM, Clint Biello R., DO

## 2015-11-28 LAB — URINE CULTURE

## 2016-05-28 ENCOUNTER — Ambulatory Visit (INDEPENDENT_AMBULATORY_CARE_PROVIDER_SITE_OTHER): Payer: 59 | Admitting: Family Medicine

## 2016-05-28 ENCOUNTER — Encounter: Payer: Self-pay | Admitting: Family Medicine

## 2016-05-28 VITALS — BP 138/88 | HR 61 | Temp 97.8°F | Ht 67.75 in | Wt 132.0 lb

## 2016-05-28 DIAGNOSIS — Z0000 Encounter for general adult medical examination without abnormal findings: Secondary | ICD-10-CM | POA: Diagnosis not present

## 2016-05-28 LAB — POC URINALSYSI DIPSTICK (AUTOMATED)
BILIRUBIN UA: NEGATIVE
Blood, UA: NEGATIVE
Glucose, UA: NEGATIVE
KETONES UA: NEGATIVE
Leukocytes, UA: NEGATIVE
NITRITE UA: NEGATIVE
Protein, UA: NEGATIVE
Spec Grav, UA: <=1.005 — AB (ref 1.010–1.025)
Urobilinogen, UA: 0.2 E.U./dL
pH, UA: 6.5 (ref 5.0–8.0)

## 2016-05-28 LAB — HEPATIC FUNCTION PANEL
ALT: 15 U/L (ref 0–35)
AST: 16 U/L (ref 0–37)
Albumin: 4.6 g/dL (ref 3.5–5.2)
Alkaline Phosphatase: 81 U/L (ref 39–117)
Bilirubin, Direct: 0.2 mg/dL (ref 0.0–0.3)
Total Bilirubin: 1.1 mg/dL (ref 0.2–1.2)
Total Protein: 6.7 g/dL (ref 6.0–8.3)

## 2016-05-28 LAB — CBC WITH DIFFERENTIAL/PLATELET
Basophils Absolute: 0 10*3/uL (ref 0.0–0.1)
Basophils Relative: 0.8 % (ref 0.0–3.0)
EOS PCT: 0.6 % (ref 0.0–5.0)
Eosinophils Absolute: 0 10*3/uL (ref 0.0–0.7)
HCT: 40.4 % (ref 36.0–46.0)
Hemoglobin: 13.3 g/dL (ref 12.0–15.0)
LYMPHS ABS: 1.1 10*3/uL (ref 0.7–4.0)
Lymphocytes Relative: 24.9 % (ref 12.0–46.0)
MCHC: 32.9 g/dL (ref 30.0–36.0)
MCV: 89.2 fl (ref 78.0–100.0)
MONO ABS: 0.3 10*3/uL (ref 0.1–1.0)
Monocytes Relative: 7.8 % (ref 3.0–12.0)
NEUTROS ABS: 2.9 10*3/uL (ref 1.4–7.7)
NEUTROS PCT: 65.9 % (ref 43.0–77.0)
PLATELETS: 124 10*3/uL — AB (ref 150.0–400.0)
RBC: 4.53 Mil/uL (ref 3.87–5.11)
RDW: 13.8 % (ref 11.5–15.5)
WBC: 4.4 10*3/uL (ref 4.0–10.5)

## 2016-05-28 LAB — LIPID PANEL
Cholesterol: 232 mg/dL — ABNORMAL HIGH (ref 0–200)
HDL: 106.9 mg/dL (ref >39.00)
LDL Cholesterol: 113 mg/dL — ABNORMAL HIGH (ref 0–99)
NONHDL: 124.8
Total CHOL/HDL Ratio: 2
Triglycerides: 59 mg/dL (ref 0.0–149.0)
VLDL: 11.8 mg/dL (ref 0.0–40.0)

## 2016-05-28 LAB — BASIC METABOLIC PANEL
BUN: 17 mg/dL (ref 6–23)
CALCIUM: 9.9 mg/dL (ref 8.4–10.5)
CO2: 30 mEq/L (ref 19–32)
Chloride: 103 mEq/L (ref 96–112)
Creatinine, Ser: 0.78 mg/dL (ref 0.40–1.20)
GFR: 81.43 mL/min (ref >60.00)
Glucose, Bld: 86 mg/dL (ref 70–99)
POTASSIUM: 3.9 meq/L (ref 3.5–5.1)
Sodium: 139 mEq/L (ref 135–145)

## 2016-05-28 LAB — TSH: TSH: 2.4 u[IU]/mL (ref 0.35–4.50)

## 2016-05-28 NOTE — Progress Notes (Signed)
   Subjective:    Patient ID: Marilee Ditommaso, female    DOB: 1960/01/30, 56 y.o.   MRN: [id number]  HPI 56 yr old female for a well exam. She feels great and has very few headaches. She is exercising regularly and has stopped using all caffeine.    Review of Systems  Constitutional: Negative.   HENT: Negative.   Eyes: Negative.   Respiratory: Negative.   Cardiovascular: Negative.   Gastrointestinal: Negative.   Genitourinary: Negative for decreased urine volume, difficulty urinating, dyspareunia, dysuria, enuresis, flank pain, frequency, hematuria, pelvic pain and urgency.  Musculoskeletal: Negative.   Skin: Negative.   Neurological: Negative.   Psychiatric/Behavioral: Negative.        Objective:   Physical Exam  Constitutional: She is oriented to person, place, and time. She appears well-developed and well-nourished. No distress.  HENT:  Head: Normocephalic and atraumatic.  Right Ear: External ear normal.  Left Ear: External ear normal.  Nose: Nose normal.  Mouth/Throat: Oropharynx is clear and moist. No oropharyngeal exudate.  Eyes: Conjunctivae and EOM are normal. Pupils are equal, round, and reactive to light. No scleral icterus.  Neck: Normal range of motion. Neck supple. No JVD present. No thyromegaly present.  Cardiovascular: Normal rate, regular rhythm, normal heart sounds and intact distal pulses.  Exam reveals no gallop and no friction rub.   No murmur heard. Pulmonary/Chest: Effort normal and breath sounds normal. No respiratory distress. She has no wheezes. She has no rales. She exhibits no tenderness.  Abdominal: Soft. Bowel sounds are normal. She exhibits no distension and no mass. There is no tenderness. There is no rebound and no guarding.  Musculoskeletal: Normal range of motion. She exhibits no edema or tenderness.  Lymphadenopathy:    She has no cervical adenopathy.  Neurological: She is alert and oriented to person, place, and time. She has normal reflexes.  No cranial nerve deficit. She exhibits normal muscle tone. Coordination normal.  Skin: Skin is warm and dry. No rash noted. No erythema.  Psychiatric: She has a normal mood and affect. Her behavior is normal. Judgment and thought content normal.          Assessment & Plan:  Well exam. We discussed diet and exercise. Get labs today. Gershon Crane, MD

## 2016-05-28 NOTE — Patient Instructions (Signed)
WE NOW OFFER   [facility] FAST TRACK!!!  SAME DAY Appointments for ACUTE CARE  Such as: Sprains, Injuries, cuts, abrasions, rashes, muscle pain, joint pain, back pain Colds, flu, sore throats, headache, allergies, cough, fever  Ear pain, sinus and eye infections Abdominal pain, nausea, vomiting, diarrhea, upset stomach Animal/insect bites  3 Easy Ways to Schedule: Walk-In Scheduling Call in scheduling Mychart Sign-up: [email or web address]         

## 2016-05-28 NOTE — Progress Notes (Signed)
Pre visit review using our clinic review tool, if applicable. No additional management support is needed unless otherwise documented below in the visit note. 

## 2016-11-29 DIAGNOSIS — Z6820 Body mass index (BMI) 20.0-20.9, adult: Secondary | ICD-10-CM | POA: Diagnosis not present

## 2016-11-29 DIAGNOSIS — Z01419 Encounter for gynecological examination (general) (routine) without abnormal findings: Secondary | ICD-10-CM | POA: Diagnosis not present

## 2016-12-17 DIAGNOSIS — Z1231 Encounter for screening mammogram for malignant neoplasm of breast: Secondary | ICD-10-CM | POA: Diagnosis not present

## 2016-12-24 ENCOUNTER — Other Ambulatory Visit: Payer: Self-pay | Admitting: Obstetrics and Gynecology

## 2016-12-24 DIAGNOSIS — R928 Other abnormal and inconclusive findings on diagnostic imaging of breast: Secondary | ICD-10-CM

## 2016-12-25 DIAGNOSIS — Z13820 Encounter for screening for osteoporosis: Secondary | ICD-10-CM | POA: Diagnosis not present

## 2016-12-27 ENCOUNTER — Encounter: Payer: Self-pay | Admitting: Radiology

## 2016-12-27 ENCOUNTER — Other Ambulatory Visit: Payer: Self-pay | Admitting: Obstetrics and Gynecology

## 2016-12-27 ENCOUNTER — Ambulatory Visit
Admission: RE | Admit: 2016-12-27 | Discharge: 2016-12-27 | Disposition: A | Discharge status: Home / Self Care | Payer: 59 | Source: Ambulatory Visit | Attending: Obstetrics and Gynecology | Admitting: Obstetrics and Gynecology

## 2016-12-27 DIAGNOSIS — R928 Other abnormal and inconclusive findings on diagnostic imaging of breast: Secondary | ICD-10-CM

## 2016-12-27 DIAGNOSIS — R921 Mammographic calcification found on diagnostic imaging of breast: Secondary | ICD-10-CM

## 2016-12-28 ENCOUNTER — Ambulatory Visit
Admission: RE | Admit: 2016-12-28 | Discharge: 2016-12-28 | Disposition: A | Discharge status: Home / Self Care | Payer: 59 | Source: Ambulatory Visit | Attending: Obstetrics and Gynecology | Admitting: Obstetrics and Gynecology

## 2016-12-28 DIAGNOSIS — R921 Mammographic calcification found on diagnostic imaging of breast: Secondary | ICD-10-CM | POA: Diagnosis not present

## 2016-12-28 DIAGNOSIS — N6011 Diffuse cystic mastopathy of right breast: Secondary | ICD-10-CM | POA: Diagnosis not present

## 2017-05-31 ENCOUNTER — Encounter: Payer: Self-pay | Admitting: Family Medicine

## 2017-05-31 ENCOUNTER — Ambulatory Visit (INDEPENDENT_AMBULATORY_CARE_PROVIDER_SITE_OTHER): Payer: 59 | Admitting: Family Medicine

## 2017-05-31 VITALS — BP 102/70 | HR 62 | Temp 98.6°F | Ht 68.5 in | Wt 131.0 lb

## 2017-05-31 DIAGNOSIS — Z0000 Encounter for general adult medical examination without abnormal findings: Secondary | ICD-10-CM | POA: Diagnosis not present

## 2017-05-31 LAB — BASIC METABOLIC PANEL
BUN: 17 mg/dL (ref 6–23)
CO2: 30 meq/L (ref 19–32)
Calcium: 9.5 mg/dL (ref 8.4–10.5)
Chloride: 105 mEq/L (ref 96–112)
Creatinine, Ser: 0.81 mg/dL (ref 0.40–1.20)
GFR: 77.68 mL/min (ref >60.00)
GLUCOSE: 91 mg/dL (ref 70–99)
POTASSIUM: 4.5 meq/L (ref 3.5–5.1)
SODIUM: 142 meq/L (ref 135–145)

## 2017-05-31 LAB — CBC WITH DIFFERENTIAL/PLATELET
BASOS ABS: 0 10*3/uL (ref 0.0–0.1)
Basophils Relative: 1.2 % (ref 0.0–3.0)
EOS PCT: 0.7 % (ref 0.0–5.0)
Eosinophils Absolute: 0 10*3/uL (ref 0.0–0.7)
HEMATOCRIT: 40.5 % (ref 36.0–46.0)
Hemoglobin: 13.5 g/dL (ref 12.0–15.0)
LYMPHS ABS: 0.7 10*3/uL (ref 0.7–4.0)
LYMPHS PCT: 16.8 % (ref 12.0–46.0)
MCHC: 33.2 g/dL (ref 30.0–36.0)
MCV: 88.5 fl (ref 78.0–100.0)
Monocytes Absolute: 0.3 10*3/uL (ref 0.1–1.0)
Monocytes Relative: 7.7 % (ref 3.0–12.0)
NEUTROS ABS: 3.2 10*3/uL (ref 1.4–7.7)
NEUTROS PCT: 73.6 % (ref 43.0–77.0)
PLATELETS: 116 10*3/uL — AB (ref 150.0–400.0)
RBC: 4.58 Mil/uL (ref 3.87–5.11)
RDW: 13 % (ref 11.5–15.5)
WBC: 4.3 10*3/uL (ref 4.0–10.5)

## 2017-05-31 LAB — LIPID PANEL
CHOLESTEROL: 254 mg/dL — AB (ref 0–200)
HDL: 109.4 mg/dL (ref >39.00)
LDL Cholesterol: 134 mg/dL — ABNORMAL HIGH (ref 0–99)
NonHDL: 144.4
TRIGLYCERIDES: 50 mg/dL (ref 0.0–149.0)
Total CHOL/HDL Ratio: 2
VLDL: 10 mg/dL (ref 0.0–40.0)

## 2017-05-31 LAB — HEPATIC FUNCTION PANEL
ALK PHOS: 75 U/L (ref 39–117)
ALT: 14 U/L (ref 0–35)
AST: 17 U/L (ref 0–37)
Albumin: 4.5 g/dL (ref 3.5–5.2)
BILIRUBIN DIRECT: 0.1 mg/dL (ref 0.0–0.3)
BILIRUBIN TOTAL: 0.9 mg/dL (ref 0.2–1.2)
Total Protein: 6.4 g/dL (ref 6.0–8.3)

## 2017-05-31 LAB — TSH: TSH: 1.91 u[IU]/mL (ref 0.35–4.50)

## 2017-05-31 MED ORDER — BUTALBITAL-APAP-CAFF-COD 50-325-40-30 MG PO CAPS: 1.0000 | ORAL_CAPSULE | ORAL | 0 refills | Status: DC | PRN

## 2017-05-31 NOTE — Progress Notes (Signed)
   Subjective:    Patient ID: Jordan Olson, female    DOB: December 17, 1960, 57 y.o.   MRN: [id number]  HPI Here for a well exam. 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 She does not take anything for this and it seems to be stable now. She exercises daily either with running or yoga.    Review of Systems  Constitutional: Negative.   HENT: Negative.   Eyes: Negative.   Respiratory: Negative.   Cardiovascular: Negative.   Gastrointestinal: Negative.   Genitourinary: Negative for decreased urine volume, difficulty urinating, dyspareunia, dysuria, enuresis, flank pain, frequency, hematuria, pelvic pain and urgency.  Musculoskeletal: Positive for arthralgias. Negative for back pain, gait problem, joint swelling and myalgias.  Skin: Negative.   Neurological: Negative.   Psychiatric/Behavioral: Negative.        Objective:   Physical Exam  Constitutional: She is oriented to person, place, and time. She appears well-developed and well-nourished. No distress.  HENT:  Head: Normocephalic and atraumatic.  Right Ear: External ear normal.  Left Ear: External ear normal.  Nose: Nose normal.  Mouth/Throat: Oropharynx is clear and moist. No oropharyngeal exudate.  Eyes: Pupils are equal, round, and reactive to light. Conjunctivae and EOM are normal. No scleral icterus.  Neck: Normal range of motion. Neck supple. No JVD present. No thyromegaly present.  Cardiovascular: Normal rate, regular rhythm, normal heart sounds and intact distal pulses. Exam reveals no gallop and no friction rub.  No murmur heard. Pulmonary/Chest: Effort normal and breath sounds normal. No respiratory distress. She has no wheezes. She has no rales. She exhibits no tenderness.  Abdominal: Soft. Bowel sounds are normal. She exhibits no distension and no mass. There is no tenderness. There is no rebound and no guarding.  Musculoskeletal: Normal range of motion. She exhibits no edema.  Both 1st CMC joints are  tender and have some crepitus   Lymphadenopathy:    She has no cervical adenopathy.  Neurological: She is alert and oriented to person, place, and time. She has normal reflexes. She displays normal reflexes. No cranial nerve deficit. She exhibits normal muscle tone. Coordination normal.  Skin: Skin is warm and dry. No rash noted. No erythema.  Psychiatric: She has a normal mood and affect. Her behavior is normal. Judgment and thought content normal.          Assessment & Plan:  Well exam. We discussed diet and exercise. Get fasting labs. She will be due for another colonoscopy in July.  Gershon Crane, MD

## 2017-08-29 HISTORY — PX: COLONOSCOPY: SHX174

## 2017-09-16 ENCOUNTER — Encounter: Payer: Self-pay | Admitting: Family Medicine

## 2017-09-16 DIAGNOSIS — Z1211 Encounter for screening for malignant neoplasm of colon: Secondary | ICD-10-CM | POA: Diagnosis not present

## 2018-03-05 DIAGNOSIS — Z01419 Encounter for gynecological examination (general) (routine) without abnormal findings: Secondary | ICD-10-CM | POA: Diagnosis not present

## 2018-09-03 ENCOUNTER — Other Ambulatory Visit: Payer: Self-pay | Admitting: Family Medicine

## 2018-09-03 ENCOUNTER — Other Ambulatory Visit: Payer: Self-pay | Admitting: Obstetrics and Gynecology

## 2018-09-03 DIAGNOSIS — Z1231 Encounter for screening mammogram for malignant neoplasm of breast: Secondary | ICD-10-CM

## 2018-10-17 ENCOUNTER — Ambulatory Visit
Admission: RE | Admit: 2018-10-17 | Discharge: 2018-10-17 | Disposition: A | Discharge status: Home / Self Care | Payer: 59 | Source: Ambulatory Visit | Attending: Obstetrics and Gynecology | Admitting: Obstetrics and Gynecology

## 2018-10-17 ENCOUNTER — Other Ambulatory Visit: Payer: Self-pay

## 2018-10-17 DIAGNOSIS — Z1231 Encounter for screening mammogram for malignant neoplasm of breast: Secondary | ICD-10-CM

## 2019-01-01 ENCOUNTER — Other Ambulatory Visit: Payer: Self-pay

## 2019-01-01 ENCOUNTER — Telehealth (INDEPENDENT_AMBULATORY_CARE_PROVIDER_SITE_OTHER): Payer: 59 | Admitting: Family Medicine

## 2019-01-01 DIAGNOSIS — K6289 Other specified diseases of anus and rectum: Secondary | ICD-10-CM

## 2019-01-01 MED ORDER — HYDROCORTISONE (PERIANAL) 2.5 % EX CREA: 1.0000 "application " | TOPICAL_CREAM | Freq: Two times a day (BID) | CUTANEOUS | 0 refills | Status: DC

## 2019-01-01 NOTE — Progress Notes (Signed)
Virtual Visit via Video Note  I connected with Jordan Olson  on 01/01/19 at 10:40 AM EST by a video enabled telemedicine application and verified that I am speaking with the correct person using two identifiers.  Location patient: work in [employer] or home office Persons participating in the virtual visit: patient, provider  I discussed the limitations of evaluation and management by telemedicine and the availability of in person appointments. The patient expressed understanding and agreed to proceed.   HPI:  Acute visit for hemorrhoid: -started having symptoms about 3 days ago -pain when sitting and walking -she reports she saw the hemorrhoid, soft, purple, now reducing -used prep H and soaking -she did have a softer stool today - she has constipation issues, but usually is well managed with diet -she eats a high fiber diet with lots water -softer stools, most days -had colonoscopy in 08/2017 normal except for internal hemorrhoids -denies bleeding  ROS: See pertinent positives and negatives per HPI.  Past Medical History:  Diagnosis Date  . Headache(784.0)   . Lyme disease 2006    Past Surgical History:  Procedure Laterality Date  . APPENDECTOMY    . BREAST BIOPSY Bilateral    Right 2011 Left 2014  . COLONOSCOPY  08-08-07   per Dr. Acquanetta Sit, clear, repeat in 10 yrs   . TONSILLECTOMY      Family History  Problem Relation Age of Onset  . Colon cancer Other   . Diabetes Other   . Breast cancer Cousin 20    SOCIAL HX: see hpi   Current Outpatient Medications:  .  butalbital-acetaminophen-caffeine (FIORICET WITH CODEINE) 50-325-40-30 MG capsule, Take 1 capsule by mouth every 4 (four) hours as needed for headache., Disp: 60 capsule, Rfl: 0 .  calcium-vitamin D (OSCAL WITH D) 500-200 MG-UNIT tablet, Take 1 tablet by mouth., Disp: , Rfl:  .  hydrocortisone (ANUSOL-HC) 2.5 % rectal cream, Place 1 application rectally 2 (two) times daily., Disp: 30 g, Rfl: 0 .   Multiple Vitamins-Minerals (MULTIVITAMIN PO), Take by mouth daily. , Disp: , Rfl:   EXAM:  VITALS per patient if applicable:  GENERAL: alert, oriented, appears well and in no acute distress  HEENT: atraumatic, conjunttiva clear, no obvious abnormalities on inspection of external nose and ears  NECK: normal movements of the head and neck  LUNGS: on inspection no signs of respiratory distress, breathing rate appears normal, no obvious gross SOB, gasping or wheezing  CV: no obvious cyanosis  MS: moves all visible extremities without noticeable abnormality  PSYCH/NEURO: pleasant and cooperative, no obvious depression or anxiety, speech and thought processing grossly intact  ASSESSMENT AND PLAN:  Discussed the following assessment and plan:  Anal or rectal pain  -we discussed possible serious and likely etiologies, options for evaluation and workup, limitations of telemedicine visit vs in person visit, treatment, treatment risks and precautions. Pt prefers to treat via telemedicine empirically rather then risking or undertaking an in person visit at this moment. Suspect hemorrhoid. Opted to treat with bowel regimen as needed to keep stool soft (fiber, water, mirilax of mag), Anusol (rx sent), sitz baths. Advised GI evaluation if worsening or not sig better over the next 1 week. Patient agrees to seek prompt in person care if worsening, new symptoms arise, or if is not improving with treatment.   I discussed the assessment and treatment plan with the patient. The patient was provided an opportunity to ask questions and all were answered. The patient agreed with the plan  and demonstrated an understanding of the instructions.   The patient was advised to call back or seek an in-person evaluation if the symptoms worsen or if the condition fails to improve as anticipated.   Lucretia Kern, DO

## 2019-01-01 NOTE — Patient Instructions (Signed)
-  I sent the medication(s) we discussed to your pharmacy: Meds ordered this encounter  Medications  . hydrocortisone (ANUSOL-HC) 2.5 % rectal cream    Sig: Place 1 application rectally 2 (two) times daily.    Dispense:  30 g    Refill:  0    Please let us know if you have any questions or concerns regarding this prescription.  Sitz baths  Keep stools soft with diet, water, fiber supplement if needed, mirilax or magnesium citrate if needed.  I hope you are feeling better soon! Seek care promptly if your symptoms worsen, new concerns arise or you are not improving with treatment.

## 2019-01-04 ENCOUNTER — Other Ambulatory Visit: Payer: Self-pay

## 2019-01-04 ENCOUNTER — Encounter (HOSPITAL_BASED_OUTPATIENT_CLINIC_OR_DEPARTMENT_OTHER): Payer: Self-pay | Admitting: Emergency Medicine

## 2019-01-04 ENCOUNTER — Emergency Department (HOSPITAL_BASED_OUTPATIENT_CLINIC_OR_DEPARTMENT_OTHER)
Admission: EM | Admit: 2019-01-04 | Discharge: 2019-01-04 | Disposition: A | Discharge status: Home / Self Care | Payer: 59 | Attending: Emergency Medicine | Admitting: Emergency Medicine

## 2019-01-04 DIAGNOSIS — Z79899 Other long term (current) drug therapy: Secondary | ICD-10-CM | POA: Insufficient documentation

## 2019-01-04 DIAGNOSIS — K602 Anal fissure, unspecified: Secondary | ICD-10-CM | POA: Diagnosis not present

## 2019-01-04 DIAGNOSIS — K649 Unspecified hemorrhoids: Secondary | ICD-10-CM

## 2019-01-04 DIAGNOSIS — K6289 Other specified diseases of anus and rectum: Secondary | ICD-10-CM | POA: Diagnosis present

## 2019-01-04 MED ORDER — HYDROCORTISONE ACETATE 25 MG RE SUPP: 25.0000 mg | Freq: Two times a day (BID) | RECTAL | 0 refills | Status: DC

## 2019-01-04 MED ORDER — HYDROCODONE-ACETAMINOPHEN 5-325 MG PO TABS
1.0000 | ORAL_TABLET | Freq: Once | ORAL | Status: AC
  Administered 2019-01-04: 1 via ORAL
  Filled 2019-01-04: qty 1

## 2019-01-04 MED ORDER — LIDOCAINE HCL URETHRAL/MUCOSAL 2 % EX GEL
CUTANEOUS | Status: AC
  Administered 2019-01-04: 1 via TOPICAL
  Filled 2019-01-04: qty 20

## 2019-01-04 MED ORDER — LIDOCAINE 5 % EX OINT: 1.0000 "application " | TOPICAL_OINTMENT | CUTANEOUS | 0 refills | Status: DC | PRN

## 2019-01-04 MED ORDER — LIDOCAINE HCL URETHRAL/MUCOSAL 2 % EX GEL
1.0000 "application " | Freq: Once | CUTANEOUS | Status: AC
  Administered 2019-01-04: 15:00:00 1 via TOPICAL
  Filled 2019-01-04: qty 20

## 2019-01-04 MED ORDER — HYDROCODONE-ACETAMINOPHEN 5-325 MG PO TABS: 2.0000 | ORAL_TABLET | ORAL | 0 refills | Status: DC | PRN

## 2019-01-04 NOTE — ED Notes (Signed)
ED Provider at bedside. 

## 2019-01-04 NOTE — ED Triage Notes (Signed)
Pt c/o painful hemorrhoids x 1 week. Pt reports using ointment with no relief

## 2019-01-04 NOTE — ED Provider Notes (Signed)
[facility] EMERGENCY DEPARTMENT Provider Note   CSN: [id number] Arrival date & time: 01/04/19  1252     History   Chief Complaint Chief Complaint  Patient presents with  . Hemorrhoids    HPI Jordan Olson is a 58 y.o. female with medical history significant for thrombocytopenia headache who presents for evaluation of rectal pain.  Patient states she has known history of internal and external hemorrhoids.  She had a telehealth visit with her PCP 3 days ago and was prescribed sitz bath and Anusol cream.  Patient states this has not helped.  She is having difficulty sitting due to the pain.  Patient states she can feel a "lump" to her rectum.  She is able to have bowel movements.  Did recently start a stool softener.  She denies fever, chills, nausea, vomiting, chest pain, shortness of breath, pallor, abdominal pain, diarrhea, constipation, dysuria. Had colonoscopy 1 year ago with internal hemorrhoids. Rates pain a 9/10. Denies additional aggravating or alleviating factors. No melena, BRBPR.  History obtained from patient and past medical records. No interpretor was used.     HPI  Past Medical History:  Diagnosis Date  . Headache(784.0)   . Lyme disease 2006    Patient Active Problem List   Diagnosis Date Noted  . Thrombocytopenia, unspecified (HCC) 11/10/2012  . OTITIS MEDIA 01/11/2009  . ACUTE SINUSITIS, UNSPECIFIED 01/11/2009  . HEADACHE 01/11/2009    Past Surgical History:  Procedure Laterality Date  . APPENDECTOMY    . BREAST BIOPSY Bilateral    Right 2011 Left 2014  . COLONOSCOPY  08-08-07   per Dr. Wandalee Ferdinand, clear, repeat in 10 yrs   . TONSILLECTOMY       OB History   No obstetric history on file.      Home Medications    Prior to Admission medications   Medication Sig Start Date End Date Taking? Authorizing Provider  butalbital-acetaminophen-caffeine (FIORICET WITH CODEINE) 50-325-40-30 MG capsule Take 1 capsule by mouth every 4 (four)  hours as needed for headache. 05/31/17   Nelwyn Salisbury, MD  calcium-vitamin D (OSCAL WITH D) 500-200 MG-UNIT tablet Take 1 tablet by mouth.    [provider]  HYDROcodone-acetaminophen (NORCO/VICODIN) 5-325 MG tablet Take 2 tablets by mouth every 4 (four) hours as needed. 01/04/19   Hektor Huston A, PA-C  hydrocortisone (ANUSOL-HC) 2.5 % rectal cream Place 1 application rectally 2 (two) times daily. 01/01/19   Terressa Koyanagi, DO  hydrocortisone (ANUSOL-HC) 25 MG suppository Place 1 suppository (25 mg total) rectally 2 (two) times daily. 01/04/19   Braden Deloach A, PA-C  lidocaine (XYLOCAINE) 5 % ointment Apply 1 application topically as needed. 01/04/19   Caprina Wussow A, PA-C  Multiple Vitamins-Minerals (MULTIVITAMIN PO) Take by mouth daily.     [provider]    Family History Family History  Problem Relation Age of Onset  . Colon cancer Other   . Diabetes Other   . Breast cancer Cousin 73    Social History Social History   Tobacco Use  . Smoking status: Never Smoker  . Smokeless tobacco: Never Used  Substance Use Topics  . Alcohol use: Yes    Alcohol/week: 0.0 standard drinks    Comment: glass of wine each night with dinner  . Drug use: No     Allergies   Patient has no known allergies.   Review of Systems Review of Systems  Constitutional: Negative.   HENT: Negative.   Respiratory: Negative.  Cardiovascular: Negative.   Gastrointestinal: Positive for rectal pain. Negative for abdominal distention, abdominal pain, anal bleeding, blood in stool, constipation, diarrhea, nausea and vomiting.  Genitourinary: Negative.   Musculoskeletal: Negative.   Skin: Negative.   Neurological: Negative.   All other systems reviewed and are negative.    Physical Exam Updated Vital Signs BP 133/87 (BP Location: Left Arm)   Pulse 68   Temp 98.4 F (36.9 C) (Oral)   Resp 16   Ht 5\' 8"  (1.727 m)   Wt 53.1 kg   LMP 04/14/2010   SpO2 99%   BMI 17.79  kg/m   Physical Exam Vitals signs and nursing note reviewed. Exam conducted with a chaperone present.  Constitutional:      General: She is not in acute distress.    Appearance: She is well-developed. She is not ill-appearing, toxic-appearing or diaphoretic.  HENT:     Head: Normocephalic and atraumatic.     Nose: Nose normal.     Mouth/Throat:     Mouth: Mucous membranes are moist.     Pharynx: Oropharynx is clear.  Eyes:     Pupils: Pupils are equal, round, and reactive to light.  Neck:     Musculoskeletal: Normal range of motion.  Cardiovascular:     Rate and Rhythm: Normal rate.     Pulses: Normal pulses.     Heart sounds: Normal heart sounds.  Pulmonary:     Effort: Pulmonary effort is normal. No respiratory distress.     Breath sounds: Normal breath sounds.  Abdominal:     General: Bowel sounds are normal. There is no distension.     Palpations: Abdomen is soft.     Tenderness: There is no abdominal tenderness. There is no right CVA tenderness, left CVA tenderness, guarding or rebound.     Hernia: No hernia is present. There is no hernia in the left inguinal area or right inguinal area.     Comments: Soft non tender without re bound or guarding. No tenderness to LLQ. Normoactive bowel sounds.  Genitourinary:    Pubic Area: No rash or pubic lice.      Vagina: Normal.     Rectum: Tenderness, anal fissure, external hemorrhoid and internal hemorrhoid present. Normal anal tone.     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 Patient with internal hemorrhoids. External partially thrombosed hemorrhoid at the 8 o clock position. Old hemorrhoid skin tag to the 6 oclock position. Anal fissure at the 6 oclock position. No lesions, induration, erythema or warmth. No cellulitis or evidence of perirectal abscess.  Stool light brown without gross melena or bright red blood per rectum. Musculoskeletal: Normal range of motion.  Lymphadenopathy:     Lower Body: No right inguinal adenopathy. No  left inguinal adenopathy.  Skin:    General: Skin is warm and dry.     Capillary Refill: Capillary refill takes less than 2 seconds.     Comments: No pallor. Brisk cap refill.  Neurological:     Mental Status: She is alert.    ED Treatments / Results  Labs (all labs ordered are listed, but only abnormal results are displayed) Labs Reviewed - No data to display  EKG None  Radiology No results found.  Procedures Procedures (including critical care time)  Medications Ordered in ED Medications  lidocaine (XYLOCAINE) 2 % jelly 1 application (1 application Topical Given 01/04/19 1434)  HYDROcodone-acetaminophen (NORCO/VICODIN) 5-325 MG per tablet 1 tablet (1 tablet Oral Given 01/04/19 1434)  Initial Impression / Assessment and Plan / ED Course  I have reviewed the triage vital signs and the nursing notes.  Pertinent labs & imaging results that were available during my care of the patient were reviewed by me and considered in my medical decision making (see chart for details).  58 year old female appears otherwise well presents for evaluation of rectal pain and known hemorrhoids.  She is afebrile, nonseptic, non-ill-appearing.  Abdomen soft, nontender.  Tolerating p.o. intake at home.  Telehealth visit by PCP prescribed Anusol and sitz bath.  PCP sent her here for possible evacuation of hemorrhoid.  Patient with partially thrombosed hemorrhoid at the 8 o'clock position.  She also has old hemorrhoid skin tag at 6 o'clock position.  She does have anal fissure at the 6 o'clock position as well.  She does have internal hemorrhoids.  No perirectal induration, fluctuance, erythema or warmth.  No evidence of cellulitis, low suspicion of perirectal abscess.  Patient provided oral and topical pain medication.  Likely due to partially thrombosed hemorrhoid as well as anal fissure.  She is to continue using the Anusol suppositories, sitz baths.  Will provide additional pain medicine at home.  Do not  feel comfortable excising her hemorrhoid here in the emergency department given this is only partially thrombosed.  She is to follow-up with general surgery.  Referral placed and she has been given contact information.  Return for any new or worsening symptoms.  Patient is nontoxic, nonseptic appearing, in no apparent distress.  Patient's pain and other symptoms adequately managed in emergency department. Patient does not meet the SIRS or Sepsis criteria.  On repeat exam patient does not have a surgical abdomin and there are no peritoneal signs.  No indication of appendicitis, bowel obstruction, bowel perforation, cholecystitis, diverticulitis abscess.  The patient has been appropriately medically screened and/or stabilized in the ED. I have low suspicion for any other emergent medical condition which would require further screening, evaluation or treatment in the ED or require inpatient management.  Patient is hemodynamically stable and in no acute distress.  Patient able to ambulate in department prior to ED.  Evaluation does not show acute pathology that would require ongoing or additional emergent interventions while in the emergency department or further inpatient treatment.  I have discussed the diagnosis with the patient and answered all questions.  Pain is been managed while in the emergency department and patient has no further complaints prior to discharge.  Patient is comfortable with plan discussed in room and is stable for discharge at this time.  I have discussed strict return precautions for returning to the emergency department.  Patient was encouraged to follow-up with PCP/specialist refer to at discharge.      Discussed patient with attending, Dr. Tomi Bamberger who agrees with above treatment, plan and disposition. Final Clinical Impressions(s) / ED Diagnoses   Final diagnoses:  Rectal pain  Hemorrhoids, unspecified hemorrhoid type  Anal fissure    ED Discharge Orders         Ordered     lidocaine (XYLOCAINE) 5 % ointment  As needed     01/04/19 1450    HYDROcodone-acetaminophen (NORCO/VICODIN) 5-325 MG tablet  Every 4 hours PRN     01/04/19 1450    hydrocortisone (ANUSOL-HC) 25 MG suppository  2 times daily     01/04/19 1450           Margo Lama A, PA-C 01/04/19 1456    Dorie Rank, MD 01/06/19 1428

## 2019-01-04 NOTE — Discharge Instructions (Signed)
You may use the topical lidocaine jelly for pain control.  I have also written you an oral medication for pain.  Please take as prescribed.  Do not drive or operate heavy machinery while taking this medication.  This medication may become addictive.  Also continue using the sitz bath 3-4 times daily as well as the Anusol suppository cream that has a steroid in it.  This will help shrink the area.

## 2019-06-04 ENCOUNTER — Other Ambulatory Visit: Payer: Self-pay

## 2019-06-05 ENCOUNTER — Encounter: Payer: Self-pay | Admitting: Family Medicine

## 2019-06-05 ENCOUNTER — Ambulatory Visit (INDEPENDENT_AMBULATORY_CARE_PROVIDER_SITE_OTHER): Payer: 59 | Admitting: Family Medicine

## 2019-06-05 VITALS — BP 110/62 | HR 59 | Temp 97.9°F | Ht 68.0 in | Wt 120.8 lb

## 2019-06-05 DIAGNOSIS — Z0000 Encounter for general adult medical examination without abnormal findings: Secondary | ICD-10-CM | POA: Diagnosis not present

## 2019-06-05 LAB — CBC WITH DIFFERENTIAL/PLATELET
Basophils Absolute: 0 10*3/uL (ref 0.0–0.1)
Basophils Relative: 0.9 % (ref 0.0–3.0)
Eosinophils Absolute: 0 10*3/uL (ref 0.0–0.7)
Eosinophils Relative: 0.7 % (ref 0.0–5.0)
HCT: 37.8 % (ref 36.0–46.0)
Hemoglobin: 12.6 g/dL (ref 12.0–15.0)
Lymphocytes Relative: 25.1 % (ref 12.0–46.0)
Lymphs Abs: 0.9 10*3/uL (ref 0.7–4.0)
MCHC: 33.4 g/dL (ref 30.0–36.0)
MCV: 91.3 fl (ref 78.0–100.0)
Monocytes Absolute: 0.3 10*3/uL (ref 0.1–1.0)
Monocytes Relative: 8.4 % (ref 3.0–12.0)
Neutro Abs: 2.3 10*3/uL (ref 1.4–7.7)
Neutrophils Relative %: 64.9 % (ref 43.0–77.0)
Platelets: 114 10*3/uL — ABNORMAL LOW (ref 150.0–400.0)
RBC: 4.14 Mil/uL (ref 3.87–5.11)
RDW: 13.8 % (ref 11.5–15.5)
WBC: 3.5 10*3/uL — ABNORMAL LOW (ref 4.0–10.5)

## 2019-06-05 LAB — BASIC METABOLIC PANEL
BUN: 20 mg/dL (ref 6–23)
CO2: 31 mEq/L (ref 19–32)
Calcium: 9.6 mg/dL (ref 8.4–10.5)
Chloride: 105 mEq/L (ref 96–112)
Creatinine, Ser: 0.87 mg/dL (ref 0.40–1.20)
GFR: 66.82 mL/min (ref >60.00)
Glucose, Bld: 93 mg/dL (ref 70–99)
Potassium: 4.4 mEq/L (ref 3.5–5.1)
Sodium: 142 mEq/L (ref 135–145)

## 2019-06-05 LAB — HEPATIC FUNCTION PANEL
ALT: 15 U/L (ref 0–35)
AST: 17 U/L (ref 0–37)
Albumin: 4.6 g/dL (ref 3.5–5.2)
Alkaline Phosphatase: 69 U/L (ref 39–117)
Bilirubin, Direct: 0.1 mg/dL (ref 0.0–0.3)
Total Bilirubin: 0.7 mg/dL (ref 0.2–1.2)
Total Protein: 6.5 g/dL (ref 6.0–8.3)

## 2019-06-05 LAB — LIPID PANEL
Cholesterol: 254 mg/dL — ABNORMAL HIGH (ref 0–200)
HDL: 105.6 mg/dL (ref >39.00)
LDL Cholesterol: 136 mg/dL — ABNORMAL HIGH (ref 0–99)
NonHDL: 148.41
Total CHOL/HDL Ratio: 2
Triglycerides: 61 mg/dL (ref 0.0–149.0)
VLDL: 12.2 mg/dL (ref 0.0–40.0)

## 2019-06-05 LAB — T4, FREE: Free T4: 0.88 ng/dL (ref 0.60–1.60)

## 2019-06-05 LAB — T3, FREE: T3, Free: 2.7 pg/mL (ref 2.3–4.2)

## 2019-06-05 LAB — TSH: TSH: 2.06 u[IU]/mL (ref 0.35–4.50)

## 2019-06-05 MED ORDER — BUTALBITAL-APAP-CAFF-COD 50-325-40-30 MG PO CAPS: 1.0000 | ORAL_CAPSULE | ORAL | 0 refills | Status: DC | PRN

## 2019-06-05 NOTE — Progress Notes (Signed)
   Subjective:    Patient ID: Jordan Olson, female    DOB: [birth year], 59 y.o.   MRN: [id number]  HPI Here for a well exam. She feels fine. She exercises daily.    Review of Systems  Constitutional: Negative.   HENT: Negative.   Eyes: Negative.   Respiratory: Negative.   Cardiovascular: Negative.   Gastrointestinal: Negative.   Genitourinary: Negative for decreased urine volume, difficulty urinating, dyspareunia, dysuria, enuresis, flank pain, frequency, hematuria, pelvic pain and urgency.  Musculoskeletal: Negative.   Skin: Negative.   Neurological: Negative.   Psychiatric/Behavioral: Negative.        Objective:   Physical Exam Constitutional:      General: She is not in acute distress.    Appearance: She is well-developed.  HENT:     Head: Normocephalic and atraumatic.     Right Ear: External ear normal.     Left Ear: External ear normal.     Nose: Nose normal.     Mouth/Throat:     Pharynx: No oropharyngeal exudate.  Eyes:     General: No scleral icterus.    Conjunctiva/sclera: Conjunctivae normal.     Pupils: Pupils are equal, round, and reactive to light.  Neck:     Thyroid: No thyromegaly.     Vascular: No JVD.  Cardiovascular:     Rate and Rhythm: Normal rate and regular rhythm.     Heart sounds: Normal heart sounds. No murmur. No friction rub. No gallop.   Pulmonary:     Effort: Pulmonary effort is normal. No respiratory distress.     Breath sounds: Normal breath sounds. No wheezing or rales.  Chest:     Chest wall: No tenderness.  Abdominal:     General: Bowel sounds are normal. There is no distension.     Palpations: Abdomen is soft. There is no mass.     Tenderness: There is no abdominal tenderness. There is no guarding or rebound.  Musculoskeletal:        General: No tenderness. Normal range of motion.     Cervical back: Normal range of motion and neck supple.  Lymphadenopathy:     Cervical: No cervical adenopathy.  Skin:    General: Skin is  warm and dry.     Findings: No erythema or rash.  Neurological:     Mental Status: She is alert and oriented to person, place, and time.     Cranial Nerves: No cranial nerve deficit.     Motor: No abnormal muscle tone.     Coordination: Coordination normal.     Deep Tendon Reflexes: Reflexes are normal and symmetric. Reflexes normal.  Psychiatric:        Behavior: Behavior normal.        Thought Content: Thought content normal.        Judgment: Judgment normal.           Assessment & Plan:  Well exam. We discussed diet and exercise. Get fasting labs.  Gershon Crane, MD

## 2019-09-08 ENCOUNTER — Other Ambulatory Visit: Payer: Self-pay | Admitting: Obstetrics and Gynecology

## 2019-09-08 DIAGNOSIS — Z1231 Encounter for screening mammogram for malignant neoplasm of breast: Secondary | ICD-10-CM

## 2019-10-26 ENCOUNTER — Other Ambulatory Visit: Payer: Self-pay

## 2019-10-26 ENCOUNTER — Ambulatory Visit
Admission: RE | Admit: 2019-10-26 | Discharge: 2019-10-26 | Disposition: A | Discharge status: Home / Self Care | Payer: 59 | Source: Ambulatory Visit | Attending: Obstetrics and Gynecology | Admitting: Obstetrics and Gynecology

## 2019-10-26 DIAGNOSIS — Z1231 Encounter for screening mammogram for malignant neoplasm of breast: Secondary | ICD-10-CM

## 2020-05-30 ENCOUNTER — Encounter: Payer: Self-pay | Admitting: Obstetrics and Gynecology

## 2020-05-30 ENCOUNTER — Ambulatory Visit (INDEPENDENT_AMBULATORY_CARE_PROVIDER_SITE_OTHER): Payer: 59 | Admitting: Obstetrics and Gynecology

## 2020-05-30 ENCOUNTER — Other Ambulatory Visit: Payer: Self-pay

## 2020-05-30 VITALS — BP 123/82 | HR 62 | Ht 68.0 in | Wt 122.0 lb

## 2020-05-30 DIAGNOSIS — Z01419 Encounter for gynecological examination (general) (routine) without abnormal findings: Secondary | ICD-10-CM

## 2020-05-30 NOTE — Progress Notes (Signed)
Subjective:     Jordan Olson is a 60 y.o. postmenopausal female with BMI 18 who is here for a comprehensive physical exam. The patient reports no problems. She is sexually active without complaints. She denies any episodes of vaginal bleeding. She is a retired Runner, broadcasting/film/video. She denies urinary incontinence. She denies pelvic pain or abnormal discharge  Past Medical History:  Diagnosis Date  . Headache(784.0)   . Lyme disease 2006   Past Surgical History:  Procedure Laterality Date  . APPENDECTOMY    . BREAST BIOPSY Bilateral    Right 2011 Left 2014  . COLONOSCOPY  08/2017   per Dr. Wandalee Ferdinand, internal hemorrhoids, no polyps, repeat in 10 yrs   . TONSILLECTOMY     Family History  Problem Relation Age of Onset  . Breast cancer Cousin 32  . Colon cancer Maternal Grandmother   . Diabetes Paternal Grandmother     Social History   Socioeconomic History  . Marital status: Married    Spouse name: Not on file  . Number of children: Not on file  . Years of education: Not on file  . Highest education level: Not on file  Occupational History  . Not on file  Tobacco Use  . Smoking status: Never Smoker  . Smokeless tobacco: Never Used  Vaping Use  . Vaping Use: Never used  Substance and Sexual Activity  . Alcohol use: Yes    Alcohol/week: 0.0 standard drinks    Comment: glass of wine each night with dinner  . Drug use: No  . Sexual activity: Yes    Partners: Male    Comment: Married   Other Topics Concern  . Not on file  Social History Narrative  . Not on file   Social Determinants of Health   Financial Resource Strain: Not on file  Food Insecurity: Not on file  Transportation Needs: Not on file  Physical Activity: Not on file  Stress: Not on file  Social Connections: Not on file  Intimate Partner Violence: Not on file   Health Maintenance  Topic Date Due  . Hepatitis C Screening  Never done  . HIV Screening  Never done  . PAP SMEAR-Modifier  12/15/2016  .  COVID-19 Vaccine (2 - Inadvertent 3-dose series) 05/16/2019  . INFLUENZA VACCINE  08/29/2020  . MAMMOGRAM  10/25/2021  . TETANUS/TDAP  05/18/2024  . COLONOSCOPY (Pts 45-31yrs Insurance coverage will need to be confirmed)  09/17/2027  . HPV VACCINES  Aged Out       Review of Systems Pertinent items noted in HPI and remainder of comprehensive ROS otherwise negative.   Objective:  Blood pressure 123/82, pulse 62, height 5\' 8"  (1.727 m), weight 122 lb (55.3 kg), last menstrual period 04/14/2010.     GENERAL: Well-developed, well-nourished female in no acute distress.  HEENT: Normocephalic, atraumatic. Sclerae anicteric.  NECK: Supple. Normal thyroid.  LUNGS: Clear to auscultation bilaterally.  HEART: Regular rate and rhythm. BREASTS: Symmetric in size. No palpable masses or lymphadenopathy, skin changes, or nipple drainage. ABDOMEN: Soft, nontender, nondistended. No organomegaly. PELVIC: Normal external female genitalia. Vagina is pink and rugated.  Normal discharge. Normal appearing cervix. Uterus is normal in size.  No adnexal mass or tenderness. EXTREMITIES: No cyanosis, clubbing, or edema, 2+ distal pulses.    Assessment:    Healthy female exam.      Plan:    Pap smear collected Patient with normal mammogram 09/2019 Patient had a colonoscopy in 2019 Patient will be contacted with  abnormal results RTC in 1 year or prn See After Visit Summary for Counseling Recommendations

## 2020-05-30 NOTE — Progress Notes (Signed)
New GYN Patient presents for Annual Exam today   Mammogram:10/26/2019 WNL Needs help getting mammogram this year. Pt notes having Biopsies done.  Family Hx of Breast Cancer: Cousin

## 2020-06-03 LAB — CYTOLOGY - PAP: Adequacy: ABNORMAL

## 2020-06-08 ENCOUNTER — Telehealth: Payer: Self-pay

## 2020-06-08 NOTE — Telephone Encounter (Signed)
-----   Message from Tereso Newcomer, MD sent at 06/03/2020  5:54 PM EDT ----- Needs another appointment to re-collect pap smear sample as this was nondiagnostic.  Please call to inform patient of results and recommendations.

## 2020-06-08 NOTE — Telephone Encounter (Signed)
Patient will call back to reschedule next week.

## 2020-07-08 ENCOUNTER — Ambulatory Visit (INDEPENDENT_AMBULATORY_CARE_PROVIDER_SITE_OTHER): Payer: 59 | Admitting: Family Medicine

## 2020-07-08 ENCOUNTER — Encounter: Payer: Self-pay | Admitting: Family Medicine

## 2020-07-08 ENCOUNTER — Other Ambulatory Visit: Payer: Self-pay

## 2020-07-08 VITALS — BP 118/80 | HR 60 | Temp 97.8°F | Ht 67.5 in | Wt 122.4 lb

## 2020-07-08 DIAGNOSIS — E785 Hyperlipidemia, unspecified: Secondary | ICD-10-CM

## 2020-07-08 DIAGNOSIS — Z0000 Encounter for general adult medical examination without abnormal findings: Secondary | ICD-10-CM

## 2020-07-08 DIAGNOSIS — H905 Unspecified sensorineural hearing loss: Secondary | ICD-10-CM | POA: Diagnosis not present

## 2020-07-08 DIAGNOSIS — E538 Deficiency of other specified B group vitamins: Secondary | ICD-10-CM

## 2020-07-08 LAB — HEPATIC FUNCTION PANEL
ALT: 17 U/L (ref 0–35)
AST: 17 U/L (ref 0–37)
Albumin: 4.5 g/dL (ref 3.5–5.2)
Alkaline Phosphatase: 69 U/L (ref 39–117)
Bilirubin, Direct: 0.1 mg/dL (ref 0.0–0.3)
Total Bilirubin: 0.7 mg/dL (ref 0.2–1.2)
Total Protein: 6.6 g/dL (ref 6.0–8.3)

## 2020-07-08 LAB — CBC WITH DIFFERENTIAL/PLATELET
Basophils Absolute: 0 10*3/uL (ref 0.0–0.1)
Basophils Relative: 0.6 % (ref 0.0–3.0)
Eosinophils Absolute: 0 10*3/uL (ref 0.0–0.7)
Eosinophils Relative: 0.5 % (ref 0.0–5.0)
HCT: 40.2 % (ref 36.0–46.0)
Hemoglobin: 13.5 g/dL (ref 12.0–15.0)
Lymphocytes Relative: 20.3 % (ref 12.0–46.0)
Lymphs Abs: 1 10*3/uL (ref 0.7–4.0)
MCHC: 33.6 g/dL (ref 30.0–36.0)
MCV: 89.2 fl (ref 78.0–100.0)
Monocytes Absolute: 0.4 10*3/uL (ref 0.1–1.0)
Monocytes Relative: 7.7 % (ref 3.0–12.0)
Neutro Abs: 3.5 10*3/uL (ref 1.4–7.7)
Neutrophils Relative %: 70.9 % (ref 43.0–77.0)
Platelets: 114 10*3/uL — ABNORMAL LOW (ref 150.0–400.0)
RBC: 4.51 Mil/uL (ref 3.87–5.11)
RDW: 13.5 % (ref 11.5–15.5)
WBC: 4.9 10*3/uL (ref 4.0–10.5)

## 2020-07-08 LAB — LIPID PANEL
Cholesterol: 256 mg/dL — ABNORMAL HIGH (ref 0–200)
HDL: 103.4 mg/dL (ref >39.00)
LDL Cholesterol: 133 mg/dL — ABNORMAL HIGH (ref 0–99)
NonHDL: 152.87
Total CHOL/HDL Ratio: 2
Triglycerides: 101 mg/dL (ref 0.0–149.0)
VLDL: 20.2 mg/dL (ref 0.0–40.0)

## 2020-07-08 LAB — BASIC METABOLIC PANEL
BUN: 16 mg/dL (ref 6–23)
CO2: 31 mEq/L (ref 19–32)
Calcium: 9.5 mg/dL (ref 8.4–10.5)
Chloride: 105 mEq/L (ref 96–112)
Creatinine, Ser: 0.84 mg/dL (ref 0.40–1.20)
GFR: 76.11 mL/min (ref >60.00)
Glucose, Bld: 96 mg/dL (ref 70–99)
Potassium: 4.2 mEq/L (ref 3.5–5.1)
Sodium: 142 mEq/L (ref 135–145)

## 2020-07-08 LAB — T3, FREE: T3, Free: 2.7 pg/mL (ref 2.3–4.2)

## 2020-07-08 LAB — VITAMIN B12: Vitamin B-12: 169 pg/mL — ABNORMAL LOW (ref 211–911)

## 2020-07-08 LAB — TSH: TSH: 2.39 u[IU]/mL (ref 0.35–4.50)

## 2020-07-08 LAB — T4, FREE: Free T4: 0.73 ng/dL (ref 0.60–1.60)

## 2020-07-08 LAB — HEMOGLOBIN A1C: Hgb A1c MFr Bld: 5.8 % (ref 4.6–6.5)

## 2020-07-08 MED ORDER — BUTALBITAL-APAP-CAFF-COD 50-325-40-30 MG PO CAPS: 1.0000 | ORAL_CAPSULE | ORAL | 0 refills | Status: DC | PRN

## 2020-07-08 NOTE — Progress Notes (Signed)
Subjective:    Patient ID: Jordan Olson, female    DOB: 11-Dec-1960, 60 y.o.   MRN: [id number]  HPI Here for a well exam. She has a few issues to discuss. 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 Her left sided hearing is normal. No ear pain or dizziness or tinnitus. She was told she needed to be cleared by a doctor before they could get her a hearing aid. Also she asks advice about transient constipation when she travels. Her BMs are normally quite regular, but she tends ot get constipated on vacations. She has heard that magnesium supplements help.    Review of Systems  Constitutional: Negative.   HENT:  Positive for hearing loss.   Eyes: Negative.   Respiratory: Negative.    Cardiovascular: Negative.   Gastrointestinal: Negative.   Genitourinary:  Negative for decreased urine volume, difficulty urinating, dyspareunia, dysuria, enuresis, flank pain, frequency, hematuria, pelvic pain and urgency.  Musculoskeletal: Negative.   Skin: Negative.   Neurological:  Positive for headaches.  Psychiatric/Behavioral: Negative.        Objective:   Physical Exam Constitutional:      General: She is not in acute distress.    Appearance: Normal appearance. She is well-developed.  HENT:     Head: Normocephalic and atraumatic.     Right Ear: Tympanic membrane, ear canal and external ear normal.     Left Ear: Tympanic membrane, ear canal and external ear normal.     Nose: Nose normal.     Mouth/Throat:     Pharynx: No oropharyngeal exudate.  Eyes:     General: No scleral icterus.    Conjunctiva/sclera: Conjunctivae normal.     Pupils: Pupils are equal, round, and reactive to light.  Neck:     Thyroid: No thyromegaly.     Vascular: No JVD.  Cardiovascular:     Rate and Rhythm: Normal rate and regular rhythm.     Heart sounds: Normal heart sounds. No murmur heard.   No friction rub. No gallop.   Pulmonary:     Effort: Pulmonary effort is normal. No respiratory distress.     Breath sounds: Normal breath sounds. No wheezing or rales.  Chest:     Chest wall: No tenderness.  Abdominal:     General: Bowel sounds are normal. There is no distension.     Palpations: Abdomen is soft. There is no mass.     Tenderness: There is no abdominal tenderness. There is no guarding or rebound.  Musculoskeletal:        General: No tenderness. Normal range of motion.     Cervical back: Normal range of motion and neck supple.  Lymphadenopathy:     Cervical: No cervical adenopathy.  Skin:    General: Skin is warm and dry.     Findings: No erythema or rash.  Neurological:     Mental Status: She is alert and oriented to person, place, and time.     Cranial Nerves: No cranial nerve deficit.     Motor: No abnormal muscle tone.     Coordination: Coordination normal.     Deep Tendon Reflexes: Reflexes are normal and symmetric. Reflexes normal.  Psychiatric:        Behavior: Behavior normal.        Thought Content: Thought content normal.        Judgment: Judgment normal.  Assessment & Plan:  Well exam. We discussed diet and exercise. For situational constipation, I suggested she use Miralax rather than magnesium. She should begin using Miralax daily one wekk before the trip and then every day during the trip. She could stop it in her arrival home. As for the unilateral hearing loss, we need to rule out an acciustic neuroma, so we will set up a brain MRI soon. Get fasting labs today. Gershon Crane, MD

## 2020-08-22 ENCOUNTER — Ambulatory Visit: Payer: 59 | Admitting: Obstetrics and Gynecology

## 2020-10-12 ENCOUNTER — Other Ambulatory Visit (INDEPENDENT_AMBULATORY_CARE_PROVIDER_SITE_OTHER): Payer: 59

## 2020-10-12 ENCOUNTER — Other Ambulatory Visit: Payer: Self-pay

## 2020-10-12 DIAGNOSIS — E538 Deficiency of other specified B group vitamins: Secondary | ICD-10-CM | POA: Diagnosis not present

## 2020-10-12 DIAGNOSIS — E785 Hyperlipidemia, unspecified: Secondary | ICD-10-CM | POA: Diagnosis not present

## 2020-10-12 LAB — LIPID PANEL
Cholesterol: 264 mg/dL — ABNORMAL HIGH (ref 0–200)
HDL: 100.2 mg/dL (ref >39.00)
LDL Cholesterol: 152 mg/dL — ABNORMAL HIGH (ref 0–99)
NonHDL: 164.18
Total CHOL/HDL Ratio: 3
Triglycerides: 60 mg/dL (ref 0.0–149.0)
VLDL: 12 mg/dL (ref 0.0–40.0)

## 2020-10-12 LAB — VITAMIN B12: Vitamin B-12: 380 pg/mL (ref 211–911)

## 2020-10-12 NOTE — Addendum Note (Signed)
Addended by: Gershon Crane A on: 10/12/2020 08:58 AM   Modules accepted: Orders

## 2020-10-12 NOTE — Addendum Note (Signed)
Addended by: Gershon Crane A on: 10/12/2020 08:54 AM   Modules accepted: Orders

## 2020-10-14 ENCOUNTER — Telehealth: Payer: Self-pay | Admitting: Family Medicine

## 2020-10-14 ENCOUNTER — Other Ambulatory Visit: Payer: Self-pay

## 2020-10-14 MED ORDER — EZETIMIBE 10 MG PO TABS: 10.0000 mg | ORAL_TABLET | Freq: Every day | ORAL | 3 refills | Status: DC

## 2020-10-14 NOTE — Telephone Encounter (Signed)
PT called to followup on her concerns from the other day when she had talked about her results and meds with Dr.Fry nurse. Please advise.

## 2020-10-14 NOTE — Telephone Encounter (Signed)
Called patient Jordan Olson 10mg  sent to .

## 2020-10-27 ENCOUNTER — Ambulatory Visit (HOSPITAL_BASED_OUTPATIENT_CLINIC_OR_DEPARTMENT_OTHER): Payer: 59 | Admitting: Radiology

## 2020-11-16 ENCOUNTER — Ambulatory Visit (HOSPITAL_BASED_OUTPATIENT_CLINIC_OR_DEPARTMENT_OTHER)
Admission: RE | Admit: 2020-11-16 | Discharge: 2020-11-16 | Disposition: A | Discharge status: Home / Self Care | Payer: 59 | Source: Ambulatory Visit | Attending: Obstetrics and Gynecology | Admitting: Obstetrics and Gynecology

## 2020-11-16 ENCOUNTER — Other Ambulatory Visit: Payer: Self-pay

## 2020-11-16 DIAGNOSIS — Z01419 Encounter for gynecological examination (general) (routine) without abnormal findings: Secondary | ICD-10-CM | POA: Diagnosis present

## 2020-11-16 DIAGNOSIS — Z1231 Encounter for screening mammogram for malignant neoplasm of breast: Secondary | ICD-10-CM | POA: Insufficient documentation

## 2021-04-25 ENCOUNTER — Telehealth (INDEPENDENT_AMBULATORY_CARE_PROVIDER_SITE_OTHER): Payer: 59 | Admitting: Family Medicine

## 2021-04-25 ENCOUNTER — Encounter: Payer: Self-pay | Admitting: Family Medicine

## 2021-04-25 VITALS — HR 62 | Temp 97.0°F | Ht 67.5 in | Wt 122.4 lb

## 2021-04-25 DIAGNOSIS — J0100 Acute maxillary sinusitis, unspecified: Secondary | ICD-10-CM

## 2021-04-25 MED ORDER — AMOXICILLIN-POT CLAVULANATE 875-125 MG PO TABS: 1.0000 | ORAL_TABLET | Freq: Two times a day (BID) | ORAL | 0 refills | Status: DC

## 2021-04-25 NOTE — Progress Notes (Signed)
Patient ID: Jordan Olson, female   DOB: Feb 09, 1960, 61 y.o.   MRN: [id number] ? ? ?This visit type was conducted due to national recommendations for restrictions regarding the COVID-19 pandemic in an effort to limit this patient's exposure and mitigate transmission in our community.  ? ?Virtual Visit via Video Note ? ?I connected with Jordan Olson on 04/25/21 at  2:45 PM EDT by a video enabled telemedicine application and verified that I am speaking with the correct person using two identifiers. ? Location patient: home ?Location provider:work or home office ?Persons participating in the virtual visit: patient, provider ? ?I discussed the limitations of evaluation and management by telemedicine and the availability of in person appointments. The patient expressed understanding and agreed to proceed. ? ? ?HPI: ? ?Jordan Olson relates onset about 3 weeks ago of typical cold-like symptoms.  She took some over-the-counter medications and did feel like she was getting better.  Then, several days ago seemed to develop some increased sinus pressure-- especially maxillary sinuses.  She had some intermittent headaches.  She had some yellow-green nasal discharge.  No cough.  No fever.  Some increased malaise.  Tried nasal saline irrigation without any real improvement.  Generally very healthy. ? ? ?ROS: See pertinent positives and negatives per HPI. ? ?Past Medical History:  ?Diagnosis Date  ? Headache(784.0)   ? Lyme disease 2006  ? ? ?Past Surgical History:  ?Procedure Laterality Date  ? APPENDECTOMY    ? BREAST BIOPSY Bilateral   ? Right 2011 Left 2014  ? COLONOSCOPY  08/2017  ? per Dr. Wandalee Ferdinand, internal hemorrhoids, no polyps, repeat in 10 yrs   ? TONSILLECTOMY    ? ? ?Family History  ?Problem Relation Age of Onset  ? Breast cancer Cousin 32  ? Colon cancer Maternal Grandmother   ? Diabetes Paternal Grandmother   ? ? ?SOCIAL HX: Non-smoker ? ? ?Current Outpatient Medications:  ?  amoxicillin-clavulanate (AUGMENTIN) 875-125  MG tablet, Take 1 tablet by mouth 2 (two) times daily., Disp: 20 tablet, Rfl: 0 ?  butalbital-acetaminophen-caffeine (FIORICET WITH CODEINE) 50-325-40-30 MG capsule, Take 1 capsule by mouth every 4 (four) hours as needed for headache., Disp: 60 capsule, Rfl: 0 ?  ezetimibe (ZETIA) 10 MG tablet, Take 1 tablet (10 mg total) by mouth daily., Disp: 90 tablet, Rfl: 3 ?  saccharomyces boulardii (FLORASTOR) 250 MG capsule, Take 250 mg by mouth daily., Disp: , Rfl:  ? ?EXAM: ? ?VITALS per patient if applicable: ? ?GENERAL: alert, oriented, appears well and in no acute distress ? ?HEENT: atraumatic, conjunttiva clear, no obvious abnormalities on inspection of external nose and ears ? ?NECK: normal movements of the head and neck ? ?LUNGS: on inspection no signs of respiratory distress, breathing rate appears normal, no obvious gross SOB, gasping or wheezing ? ?CV: no obvious cyanosis ? ?MS: moves all visible extremities without noticeable abnormality ? ?PSYCH/NEURO: pleasant and cooperative, no obvious depression or anxiety, speech and thought processing grossly intact ? ?ASSESSMENT AND PLAN: ? ?Discussed the following assessment and plan: ? ?Probable acute sinusitis following recent viral URI ? ?-Given duration of symptoms started Augmentin 875 mg twice daily with food for 10 days ?-Continue saline nasal irrigation ?-Continue good hydration ?-Follow-up for any persistent or worsening symptoms ? ? ?  ?I discussed the assessment and treatment plan with the patient. The patient was provided an opportunity to ask questions and all were answered. The patient agreed with the plan and demonstrated an understanding of the instructions. ?  ?  The patient was advised to call back or seek an in-person evaluation if the symptoms worsen or if the condition fails to improve as anticipated. ? ? ? ? ?Evelena Peat, MD  ? ?

## 2021-07-12 ENCOUNTER — Ambulatory Visit (INDEPENDENT_AMBULATORY_CARE_PROVIDER_SITE_OTHER): Payer: 59 | Admitting: Family Medicine

## 2021-07-12 ENCOUNTER — Encounter: Payer: Self-pay | Admitting: Family Medicine

## 2021-07-12 VITALS — BP 104/72 | HR 99 | Temp 98.4°F | Ht 68.25 in | Wt 121.0 lb

## 2021-07-12 DIAGNOSIS — Z0000 Encounter for general adult medical examination without abnormal findings: Secondary | ICD-10-CM | POA: Diagnosis not present

## 2021-07-12 LAB — CBC WITH DIFFERENTIAL/PLATELET
Basophils Absolute: 0 10*3/uL (ref 0.0–0.1)
Basophils Relative: 1.2 % (ref 0.0–3.0)
Eosinophils Absolute: 0 10*3/uL (ref 0.0–0.7)
Eosinophils Relative: 1.7 % (ref 0.0–5.0)
HCT: 40.1 % (ref 36.0–46.0)
Hemoglobin: 13.4 g/dL (ref 12.0–15.0)
Lymphocytes Relative: 31.9 % (ref 12.0–46.0)
Lymphs Abs: 1 10*3/uL (ref 0.7–4.0)
MCHC: 33.3 g/dL (ref 30.0–36.0)
MCV: 90.2 fl (ref 78.0–100.0)
Monocytes Absolute: 0.3 10*3/uL (ref 0.1–1.0)
Monocytes Relative: 11 % (ref 3.0–12.0)
Neutro Abs: 1.6 10*3/uL (ref 1.4–7.7)
Neutrophils Relative %: 54.2 % (ref 43.0–77.0)
Platelets: 111 10*3/uL — ABNORMAL LOW (ref 150.0–400.0)
RBC: 4.45 Mil/uL (ref 3.87–5.11)
RDW: 13.6 % (ref 11.5–15.5)
WBC: 3 10*3/uL — ABNORMAL LOW (ref 4.0–10.5)

## 2021-07-12 LAB — HEPATIC FUNCTION PANEL
ALT: 20 U/L (ref 0–35)
AST: 19 U/L (ref 0–37)
Albumin: 4.6 g/dL (ref 3.5–5.2)
Alkaline Phosphatase: 69 U/L (ref 39–117)
Bilirubin, Direct: 0.1 mg/dL (ref 0.0–0.3)
Total Bilirubin: 0.6 mg/dL (ref 0.2–1.2)
Total Protein: 6.8 g/dL (ref 6.0–8.3)

## 2021-07-12 LAB — BASIC METABOLIC PANEL
BUN: 17 mg/dL (ref 6–23)
CO2: 29 mEq/L (ref 19–32)
Calcium: 9.9 mg/dL (ref 8.4–10.5)
Chloride: 105 mEq/L (ref 96–112)
Creatinine, Ser: 0.86 mg/dL (ref 0.40–1.20)
GFR: 73.47 mL/min (ref >60.00)
Glucose, Bld: 88 mg/dL (ref 70–99)
Potassium: 4.8 mEq/L (ref 3.5–5.1)
Sodium: 140 mEq/L (ref 135–145)

## 2021-07-12 LAB — LIPID PANEL
Cholesterol: 245 mg/dL — ABNORMAL HIGH (ref 0–200)
HDL: 112.3 mg/dL (ref >39.00)
LDL Cholesterol: 117 mg/dL — ABNORMAL HIGH (ref 0–99)
NonHDL: 132.43
Total CHOL/HDL Ratio: 2
Triglycerides: 78 mg/dL (ref 0.0–149.0)
VLDL: 15.6 mg/dL (ref 0.0–40.0)

## 2021-07-12 LAB — HEMOGLOBIN A1C: Hgb A1c MFr Bld: 5.6 % (ref 4.6–6.5)

## 2021-07-12 LAB — TSH: TSH: 3.21 u[IU]/mL (ref 0.35–5.50)

## 2021-07-12 LAB — VITAMIN D 25 HYDROXY (VIT D DEFICIENCY, FRACTURES): VITD: 53.16 ng/mL (ref 30.00–100.00)

## 2021-07-12 LAB — VITAMIN B12: Vitamin B-12: 390 pg/mL (ref 211–911)

## 2021-07-12 MED ORDER — BUTALBITAL-APAP-CAFF-COD 50-325-40-30 MG PO CAPS: 1.0000 | ORAL_CAPSULE | ORAL | 0 refills | Status: DC | PRN

## 2021-07-12 NOTE — Progress Notes (Signed)
   Subjective:    Patient ID: Jordan Olson, female    DOB: [birth year], 61 y.o.   MRN: [id number]  HPI Here for a well exam. She is doing well.    Review of Systems  Constitutional: Negative.   HENT: Negative.    Eyes: Negative.   Respiratory: Negative.    Cardiovascular: Negative.   Gastrointestinal: Negative.   Genitourinary:  Negative for decreased urine volume, difficulty urinating, dyspareunia, dysuria, enuresis, flank pain, frequency, hematuria, pelvic pain and urgency.  Musculoskeletal: Negative.   Skin: Negative.   Neurological: Negative.  Negative for headaches.  Psychiatric/Behavioral: Negative.         Objective:   Physical Exam Constitutional:      General: She is not in acute distress.    Appearance: Normal appearance. She is well-developed.  HENT:     Head: Normocephalic and atraumatic.     Right Ear: External ear normal.     Left Ear: External ear normal.     Nose: Nose normal.     Mouth/Throat:     Pharynx: No oropharyngeal exudate.  Eyes:     General: No scleral icterus.    Conjunctiva/sclera: Conjunctivae normal.     Pupils: Pupils are equal, round, and reactive to light.  Neck:     Thyroid: No thyromegaly.     Vascular: No JVD.  Cardiovascular:     Rate and Rhythm: Normal rate and regular rhythm.     Heart sounds: Normal heart sounds. No murmur heard.    No friction rub. No gallop.  Pulmonary:     Effort: Pulmonary effort is normal. No respiratory distress.     Breath sounds: Normal breath sounds. No wheezing or rales.  Chest:     Chest wall: No tenderness.  Abdominal:     General: Bowel sounds are normal. There is no distension.     Palpations: Abdomen is soft. There is no mass.     Tenderness: There is no abdominal tenderness. There is no guarding or rebound.  Musculoskeletal:        General: No tenderness. Normal range of motion.     Cervical back: Normal range of motion and neck supple.  Lymphadenopathy:     Cervical: No cervical  adenopathy.  Skin:    General: Skin is warm and dry.     Findings: No erythema or rash.  Neurological:     Mental Status: She is alert and oriented to person, place, and time.     Cranial Nerves: No cranial nerve deficit.     Motor: No abnormal muscle tone.     Coordination: Coordination normal.     Deep Tendon Reflexes: Reflexes are normal and symmetric. Reflexes normal.  Psychiatric:        Behavior: Behavior normal.        Thought Content: Thought content normal.        Judgment: Judgment normal.           Assessment & Plan:  Well exam. We discussed diet and exercise. Get fasting labs. Gershon Crane, MD

## 2021-09-08 ENCOUNTER — Other Ambulatory Visit: Payer: Self-pay | Admitting: Family Medicine

## 2021-10-10 ENCOUNTER — Encounter: Payer: Self-pay | Admitting: Family Medicine

## 2021-10-10 ENCOUNTER — Ambulatory Visit: Payer: Commercial Managed Care - HMO | Admitting: Family Medicine

## 2021-10-10 VITALS — BP 126/84 | HR 72 | Temp 98.8°F | Wt 123.5 lb

## 2021-10-10 DIAGNOSIS — K611 Rectal abscess: Secondary | ICD-10-CM

## 2021-10-10 MED ORDER — DOXYCYCLINE HYCLATE 100 MG PO CAPS: 100.0000 mg | ORAL_CAPSULE | Freq: Two times a day (BID) | ORAL | 0 refills | Status: AC

## 2021-10-10 NOTE — Progress Notes (Signed)
   Subjective:    Patient ID: Jordan Olson, female    DOB: 09/06/1960, 61 y.o.   MRN: [id number]  HPI Here to check what she thinks is an abscess near the anus. She has a long hx of hemorrhoids that swell and become painful, but she says this is very different from that. She has never had this before. No fever. Her BMs are normal. She says it "ruptured" a few days ago and she saw a lot of brownish fluid come out. Now it feels much smaller.    Review of Systems  Constitutional: Negative.   Respiratory: Negative.    Cardiovascular: Negative.   Gastrointestinal:  Positive for rectal pain. Negative for abdominal pain, anal bleeding, blood in stool, constipation and diarrhea.       Objective:   Physical Exam Constitutional:      Appearance: Normal appearance. She is not ill-appearing.  Cardiovascular:     Rate and Rhythm: Normal rate and regular rhythm.     Pulses: Normal pulses.     Heart sounds: Normal heart sounds.  Pulmonary:     Effort: Pulmonary effort is normal.     Breath sounds: Normal breath sounds.  Genitourinary:    Comments: She has several non-inflamed external hemorrhoids. There is also the remnant of a perirectal abscess that opened and drained. I was able to express a small amount of purulent fluid from this.  Neurological:     Mental Status: She is alert.           Assessment & Plan:  Perirectal abscess. She will do Sitz baths BID for a week or so. She will take 10 days of Doxycycline. Recheck as needed. Gershon Crane, MD

## 2021-11-08 ENCOUNTER — Other Ambulatory Visit (HOSPITAL_BASED_OUTPATIENT_CLINIC_OR_DEPARTMENT_OTHER): Payer: Self-pay | Admitting: Family Medicine

## 2021-11-08 DIAGNOSIS — Z1231 Encounter for screening mammogram for malignant neoplasm of breast: Secondary | ICD-10-CM

## 2021-11-14 ENCOUNTER — Encounter: Payer: Self-pay | Admitting: Family Medicine

## 2021-11-14 ENCOUNTER — Ambulatory Visit: Payer: Commercial Managed Care - HMO | Admitting: Family Medicine

## 2021-11-14 VITALS — BP 108/78 | HR 94 | Temp 99.1°F | Wt 125.2 lb

## 2021-11-14 DIAGNOSIS — K611 Rectal abscess: Secondary | ICD-10-CM

## 2021-11-14 MED ORDER — DOXYCYCLINE HYCLATE 100 MG PO CAPS: 100.0000 mg | ORAL_CAPSULE | Freq: Two times a day (BID) | ORAL | 0 refills | Status: AC

## 2021-11-14 NOTE — Progress Notes (Signed)
   Subjective:    Patient ID: Jordan Olson, female    DOB: 08-Apr-1960, 61 y.o.   MRN: [id number]  HPI Here for continuing problems with a perirectal abscess. This started about 7 weeks ago. We saw her on 10-10-21 for this, and we treated her with Doxycycline. This seemed to quiet things down, but it never went away. Now for the past 2 weeks the area has become more painful and it is draining brown fluid again. No fever. She passes her stools easily. She is using Sitz baths and applying Preparation H.    Review of Systems  Constitutional: Negative.   Respiratory: Negative.    Cardiovascular: Negative.   Gastrointestinal:  Positive for rectal pain. Negative for abdominal distention, abdominal pain, blood in stool, constipation, diarrhea, nausea and vomiting.       Objective:   Physical Exam Constitutional:      Appearance: Normal appearance. She is not ill-appearing.  Cardiovascular:     Rate and Rhythm: Normal rate and regular rhythm.     Pulses: Normal pulses.     Heart sounds: Normal heart sounds.  Pulmonary:     Effort: Pulmonary effort is normal.     Breath sounds: Normal breath sounds.  Neurological:     Mental Status: She is alert.           Assessment & Plan:  Perirectal abscess. We will refer her to Surgery to consider having this excised. Cover with 10 days of Doxycycline. Alysia Penna, MD

## 2021-11-17 ENCOUNTER — Ambulatory Visit (HOSPITAL_BASED_OUTPATIENT_CLINIC_OR_DEPARTMENT_OTHER)
Admission: RE | Admit: 2021-11-17 | Discharge: 2021-11-17 | Disposition: A | Discharge status: Home / Self Care | Payer: Commercial Managed Care - HMO | Source: Ambulatory Visit | Attending: Family Medicine | Admitting: Family Medicine

## 2021-11-17 DIAGNOSIS — Z1231 Encounter for screening mammogram for malignant neoplasm of breast: Secondary | ICD-10-CM | POA: Diagnosis present

## 2021-11-21 ENCOUNTER — Telehealth: Payer: Self-pay | Admitting: Family Medicine

## 2021-11-21 NOTE — Telephone Encounter (Signed)
Pt awaiting scheduling for surgical referral, dealing with pain and asking is there anything she can take for the pain other than ibuprofen, which she feels she is taking an unhealthy amount of.

## 2021-11-23 NOTE — Telephone Encounter (Signed)
Referral sent to [facility] on 11/14/21

## 2021-11-24 MED ORDER — TRAMADOL HCL 50 MG PO TABS: 100.0000 mg | ORAL_TABLET | Freq: Four times a day (QID) | ORAL | 0 refills | Status: DC | PRN

## 2021-11-24 NOTE — Addendum Note (Signed)
Addended by: Alysia Penna A on: 11/24/2021 10:25 AM   Modules accepted: Orders

## 2021-11-24 NOTE — Telephone Encounter (Signed)
I sent in Tramadol to [facility] that she can use for pain. Please contact [facility] to check on this referral

## 2021-11-28 ENCOUNTER — Telehealth: Payer: Self-pay | Admitting: Family Medicine

## 2021-11-28 NOTE — Telephone Encounter (Addendum)
Pt called to say Jordan Olson is telling her they have not received anything regarding the referral.  Pt is stating that [facility] is saying the same thing.  Pt is asking that this get resolved as soon as possible.

## 2021-11-28 NOTE — Telephone Encounter (Signed)
I saw her on 11-14-21 for a perirectal abscess and referred her to [facility]. Neither Surgery nor Jordan Olson has heard anything about this. Please look into why this referral was NOT done

## 2021-11-29 NOTE — Telephone Encounter (Signed)
I reached out to [facility] and spoke with Hassan Rowan who is the manager over the referral department.    Hassan Rowan stated that the referral was never placed in [facility], so their office was never aware of the pending referral.     Hassan Rowan stated she is going to pull the referral out of [facility] and she will call the patient to get her scheduled.      I spoke with the patient explained the situation, also extended apologies.

## 2021-11-30 NOTE — Telephone Encounter (Signed)
Thanks for checking on this. But if [facility] uses [facility] and we don't , how do we send them referrals?

## 2021-11-30 NOTE — Telephone Encounter (Signed)
Noted. Thanks.

## 2021-12-01 NOTE — Telephone Encounter (Signed)
Please check on the hold up on referral placed by Dr Sarajane Jews to [facility]

## 2021-12-04 NOTE — Telephone Encounter (Signed)
Patient is scheduled for 11/14

## 2021-12-12 ENCOUNTER — Other Ambulatory Visit: Payer: Self-pay | Admitting: General Surgery

## 2021-12-12 DIAGNOSIS — K603 Anal fistula: Secondary | ICD-10-CM

## 2021-12-18 ENCOUNTER — Ambulatory Visit: Payer: Self-pay | Admitting: General Surgery

## 2021-12-18 ENCOUNTER — Ambulatory Visit
Admission: RE | Admit: 2021-12-18 | Discharge: 2021-12-18 | Disposition: A | Discharge status: Home / Self Care | Payer: Commercial Managed Care - HMO | Source: Ambulatory Visit | Attending: General Surgery | Admitting: General Surgery

## 2021-12-18 DIAGNOSIS — K603 Anal fistula: Secondary | ICD-10-CM

## 2021-12-18 MED ORDER — GADOBENATE DIMEGLUMINE 529 MG/ML IV SOLN
11.0000 mL | Freq: Once | INTRAVENOUS | Status: AC | PRN
  Administered 2021-12-18: 11 mL via INTRAVENOUS

## 2021-12-18 NOTE — H&P (View-Only) (Signed)
    REFERRING PHYSICIAN:  Fry, Stephen Ashley, MD   PROVIDER:  Christoffer Currier CHRISTINE Anasia Agro, MD   MRN: [id number] DOB: 05/03/1960 DATE OF ENCOUNTER: 12/12/2021   Subjective    Chief Complaint: Perirectal abscess       History of Present Illness: Jordan Olson is a 61 y.o. female who is seen today as an office consultation at the request of Dr. Fry for evaluation of Perirectal abscess .  Patient states this has been present since September.  She has tried a couple rounds of antibiotics but continues to have consistent drainage.  She has never had anything like this before.     Review of Systems: A complete review of systems was obtained from the patient.  I have reviewed this information and discussed as appropriate with the patient.  See HPI as well for other ROS.     Medical History: Past Medical History      Past Medical History:  Diagnosis Date   Migraine     Perirectal abscess             Patient Active Problem List  Diagnosis   Visual field defect - Both Eyes   Droopy eyelid   Myogenic ptosis   Dermatochalasis   MGD (meibomian gland disease)   Dry eyes      Past Surgical History       Past Surgical History:  Procedure Laterality Date   APPENDECTOMY   01/30/1988   BLEPHAROPLASTY UPPER EYELID Bilateral 12/19/2012    BUL bleph with Co2 Laser - Dr. Gandhi        Allergies  No Known Allergies           Current Outpatient Medications on File Prior to Visit  Medication Sig Dispense Refill   ezetimibe (ZETIA) 10 mg tablet Take 1 tablet by mouth once daily       traMADoL (ULTRAM) 50 mg tablet Take by mouth       butalbital-acetaminophen-caffeine (FIORICET) 50-325-40 mg tablet Take 1 tablet by mouth continuously as needed for Pain.       calcium carbonate-vitamin D3 (CALTRATE 600+D) 600 mg(1,500mg) -400 unit tablet Take 1 tablet by mouth.       naproxen sodium (ALEVE, ANAPROX) 220 MG tablet Take 220 mg by mouth continuously as needed for Pain.        neomycin-polymyxin-dexamethasone (MAXITROL) ophthalmic ointment Apply ointment to incision sites 4 x a day for 10 days 3.5 g 1   promethazine (PHENERGAN) 25 MG tablet Take 1 tablet (25 mg total) by mouth every 6 (six) hours as needed for Nausea. 15 tablet 0    No current facility-administered medications on file prior to visit.      Family History       Family History  Problem Relation Age of Onset   Hyperlipidemia (Elevated cholesterol) Mother     Atrial fibrillation (Abnormal heart rhythm sometimes requiring treatment with blood thinners) Father     Diabetes Paternal Grandmother     Glaucoma Neg Hx     Macular degeneration Neg Hx          Social History       Tobacco Use  Smoking Status Never  Smokeless Tobacco Never      Social History  Social History         Socioeconomic History   Marital status: Unknown  Tobacco Use   Smoking status: Never   Smokeless tobacco: Never  Substance and Sexual Activity   Alcohol use: Yes        Comment: 1-2 drinks most days   Drug use: No        Objective:         Vitals:    12/12/21 1056  BP: 118/80  Pulse: 66  Temp: 36.8 C (98.2 F)  SpO2: 99%  Weight: 56 kg (123 lb 6.4 oz)  Height: 172.7 cm (5\' 8" )      Exam Gen: NAD Abd: soft Rectal: Approximately 5 cm fluctuant area in the right anterior perianal area.  This is draining some purulence.     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 I&D was attempted in the office but noted fluid could be drained.  MRI performed 12/18/2021.  This shows a possible intersphincteric abscess and fistula.  I discussed this with her via the phone.  We will plan on proceeding to the  operating room for exam under anesthesia and probable incision and drainage of intersphincteric abscess.  Risk include bleeding, continued infection and recurrence.     12/20/2021, MD Colon and Rectal Surgery [facility]

## 2021-12-18 NOTE — H&P (Signed)
REFERRING PHYSICIAN:  Jacquenette Shone, MD   PROVIDER:  Monico Blitz, MD   MRN: [id number] DOB: 03-Oct-1960 DATE OF ENCOUNTER: 12/12/2021   Subjective    Chief Complaint: Perirectal abscess       History of Present Illness: Jordan Olson is a 61 y.o. female who is seen today as an office consultation at the request of Dr. Sarajane Jews for evaluation of Perirectal abscess .  Patient states this has been present since September.  She has tried a couple rounds of antibiotics but continues to have consistent drainage.  She has never had anything like this before.     Review of Systems: A complete review of systems was obtained from the patient.  I have reviewed this information and discussed as appropriate with the patient.  See HPI as well for other ROS.     Medical History: Past Medical History      Past Medical History:  Diagnosis Date   Migraine     Perirectal abscess             Patient Active Problem List  Diagnosis   Visual field defect - Both Eyes   Droopy eyelid   Myogenic ptosis   Dermatochalasis   MGD (meibomian gland disease)   Dry eyes      Past Surgical History       Past Surgical History:  Procedure Laterality Date   APPENDECTOMY   01/30/1988   BLEPHAROPLASTY UPPER EYELID Bilateral 12/19/2012    BUL bleph with Co2 Laser - Dr. Duke Salvia        Allergies  No Known Allergies           Current Outpatient Medications on File Prior to Visit  Medication Sig Dispense Refill   ezetimibe (ZETIA) 10 mg tablet Take 1 tablet by mouth once daily       traMADoL (ULTRAM) 50 mg tablet Take by mouth       butalbital-acetaminophen-caffeine (FIORICET) 50-325-40 mg tablet Take 1 tablet by mouth continuously as needed for Pain.       calcium carbonate-vitamin D3 (CALTRATE 600+D) 600 mg(1,500mg ) -400 unit tablet Take 1 tablet by mouth.       naproxen sodium (ALEVE, ANAPROX) 220 MG tablet Take 220 mg by mouth continuously as needed for Pain.        neomycin-polymyxin-dexamethasone (MAXITROL) ophthalmic ointment Apply ointment to incision sites 4 x a day for 10 days 3.5 g 1   promethazine (PHENERGAN) 25 MG tablet Take 1 tablet (25 mg total) by mouth every 6 (six) hours as needed for Nausea. 15 tablet 0    No current facility-administered medications on file prior to visit.      Family History       Family History  Problem Relation Age of Onset   Hyperlipidemia (Elevated cholesterol) Mother     Atrial fibrillation (Abnormal heart rhythm sometimes requiring treatment with blood thinners) Father     Diabetes Paternal Grandmother     Glaucoma Neg Hx     Macular degeneration Neg Hx          Social History       Tobacco Use  Smoking Status Never  Smokeless Tobacco Never      Social History  Social History         Socioeconomic History   Marital status: Unknown  Tobacco Use   Smoking status: Never   Smokeless tobacco: Never  Substance and Sexual Activity   Alcohol use: Yes  Comment: 1-2 drinks most days   Drug use: No        Objective:         Vitals:    12/12/21 1056  BP: 118/80  Pulse: 66  Temp: 36.8 C (98.2 F)  SpO2: 99%  Weight: 56 kg (123 lb 6.4 oz)  Height: 172.7 cm (5\' 8" )      Exam Gen: NAD Abd: soft Rectal: Approximately 5 cm fluctuant area in the right anterior perianal area.  This is draining some purulence.     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 I&D was attempted in the office but noted fluid could be drained.  MRI performed 12/18/2021.  This shows a possible intersphincteric abscess and fistula.  I discussed this with her via the phone.  We will plan on proceeding to the  operating room for exam under anesthesia and probable incision and drainage of intersphincteric abscess.  Risk include bleeding, continued infection and recurrence.     12/20/2021, MD Colon and Rectal Surgery [facility]

## 2021-12-25 ENCOUNTER — Encounter (HOSPITAL_BASED_OUTPATIENT_CLINIC_OR_DEPARTMENT_OTHER): Payer: Self-pay | Admitting: General Surgery

## 2021-12-25 NOTE — Progress Notes (Signed)
Spoke w/ via phone for pre-op interview--- pt Lab needs dos---- no               Lab results------ no COVID test -----patient states asymptomatic no test needed Arrive at -------  1200 on  12-28-2021 NPO after MN NO Solid Food.  Clear liquids from MN until--- 1100 Med rec completed Medications to take morning of surgery ----- zetia, if needed may take prn meds for migraines Diabetic medication ----- n/a Patient instructed no nail polish to be worn day of surgery Patient instructed to bring photo id and insurance card day of surgery Patient aware to have Driver (ride ) / caregiver  for 24 hours after surgery --- husband, Jordan Olson Patient Special Instructions ----- n/a Pre-Op special Istructions ----- n/a Patient verbalized understanding of instructions that were given at this phone interview. Patient denies shortness of breath, chest pain, fever, cough at this phone interview.

## 2021-12-28 ENCOUNTER — Encounter (HOSPITAL_BASED_OUTPATIENT_CLINIC_OR_DEPARTMENT_OTHER): Payer: Self-pay | Admitting: General Surgery

## 2021-12-28 ENCOUNTER — Ambulatory Visit (HOSPITAL_BASED_OUTPATIENT_CLINIC_OR_DEPARTMENT_OTHER)
Admission: RE | Admit: 2021-12-28 | Discharge: 2021-12-28 | Disposition: A | Discharge status: Home / Self Care | Payer: Commercial Managed Care - HMO | Source: Ambulatory Visit | Attending: General Surgery | Admitting: General Surgery

## 2021-12-28 ENCOUNTER — Other Ambulatory Visit: Payer: Self-pay

## 2021-12-28 ENCOUNTER — Ambulatory Visit (HOSPITAL_BASED_OUTPATIENT_CLINIC_OR_DEPARTMENT_OTHER): Payer: Commercial Managed Care - HMO | Admitting: Anesthesiology

## 2021-12-28 ENCOUNTER — Encounter (HOSPITAL_BASED_OUTPATIENT_CLINIC_OR_DEPARTMENT_OTHER)
Admission: RE | Disposition: A | Discharge status: Home / Self Care | Payer: Self-pay | Source: Ambulatory Visit | Attending: General Surgery

## 2021-12-28 DIAGNOSIS — K6289 Other specified diseases of anus and rectum: Secondary | ICD-10-CM | POA: Diagnosis not present

## 2021-12-28 DIAGNOSIS — K614 Intrasphincteric abscess: Secondary | ICD-10-CM

## 2021-12-28 DIAGNOSIS — K620 Anal polyp: Secondary | ICD-10-CM | POA: Insufficient documentation

## 2021-12-28 DIAGNOSIS — Z01818 Encounter for other preprocedural examination: Secondary | ICD-10-CM

## 2021-12-28 HISTORY — DX: Migraine, unspecified, not intractable, without status migrainosus: G43.909

## 2021-12-28 HISTORY — DX: Family history of other specified conditions: Z84.89

## 2021-12-28 HISTORY — DX: Unspecified hemorrhoids: K64.9

## 2021-12-28 HISTORY — DX: Presence of spectacles and contact lenses: Z97.3

## 2021-12-28 HISTORY — PX: RECTAL EXAM UNDER ANESTHESIA: SHX6399

## 2021-12-28 HISTORY — DX: Other constipation: K59.09

## 2021-12-28 HISTORY — DX: Anal abscess: K61.0

## 2021-12-28 SURGERY — EXAM UNDER ANESTHESIA, RECTUM
Anesthesia: General | Site: Rectum

## 2021-12-28 MED ORDER — ACETAMINOPHEN 500 MG PO TABS
1000.0000 mg | ORAL_TABLET | ORAL | Status: AC
  Administered 2021-12-28: 1000 mg via ORAL

## 2021-12-28 MED ORDER — PROPOFOL 500 MG/50ML IV EMUL
INTRAVENOUS | Status: DC | PRN
  Administered 2021-12-28: 200 ug/kg/min via INTRAVENOUS

## 2021-12-28 MED ORDER — SODIUM CHLORIDE 0.9% FLUSH: 3.0000 mL | Freq: Two times a day (BID) | INTRAVENOUS | Status: DC

## 2021-12-28 MED ORDER — FENTANYL CITRATE (PF) 100 MCG/2ML IJ SOLN: 25.0000 ug | INTRAMUSCULAR | Status: DC | PRN

## 2021-12-28 MED ORDER — FENTANYL CITRATE (PF) 100 MCG/2ML IJ SOLN
INTRAMUSCULAR | Status: DC | PRN
  Administered 2021-12-28: 25 ug via INTRAVENOUS
  Administered 2021-12-28: 50 ug via INTRAVENOUS
  Administered 2021-12-28: 25 ug via INTRAVENOUS

## 2021-12-28 MED ORDER — TRAMADOL HCL 50 MG PO TABS: 50.0000 mg | ORAL_TABLET | Freq: Four times a day (QID) | ORAL | 0 refills | Status: DC | PRN

## 2021-12-28 MED ORDER — 0.9 % SODIUM CHLORIDE (POUR BTL) OPTIME
TOPICAL | Status: DC | PRN
  Administered 2021-12-28: 500 mL

## 2021-12-28 MED ORDER — MIDAZOLAM HCL 5 MG/5ML IJ SOLN
INTRAMUSCULAR | Status: DC | PRN
  Administered 2021-12-28: 2 mg via INTRAVENOUS

## 2021-12-28 MED ORDER — LACTATED RINGERS IV SOLN: INTRAVENOUS | Status: DC

## 2021-12-28 MED ORDER — PROPOFOL 10 MG/ML IV BOLUS
INTRAVENOUS | Status: DC | PRN
  Administered 2021-12-28: 20 mg via INTRAVENOUS
  Administered 2021-12-28 (×2): 10 mg via INTRAVENOUS

## 2021-12-28 MED ORDER — FENTANYL CITRATE (PF) 100 MCG/2ML IJ SOLN
INTRAMUSCULAR | Status: AC
  Filled 2021-12-28: qty 2

## 2021-12-28 MED ORDER — MIDAZOLAM HCL 2 MG/2ML IJ SOLN
INTRAMUSCULAR | Status: AC
  Filled 2021-12-28: qty 2

## 2021-12-28 MED ORDER — KETOROLAC TROMETHAMINE 30 MG/ML IJ SOLN
INTRAMUSCULAR | Status: DC | PRN
  Administered 2021-12-28: 30 mg via INTRAVENOUS

## 2021-12-28 MED ORDER — BUPIVACAINE-EPINEPHRINE 0.5% -1:200000 IJ SOLN
INTRAMUSCULAR | Status: DC | PRN
  Administered 2021-12-28: 30 mL

## 2021-12-28 MED ORDER — ONDANSETRON HCL 4 MG/2ML IJ SOLN
INTRAMUSCULAR | Status: DC | PRN
  Administered 2021-12-28: 4 mg via INTRAVENOUS

## 2021-12-28 MED ORDER — ACETAMINOPHEN 500 MG PO TABS
ORAL_TABLET | ORAL | Status: AC
  Filled 2021-12-28: qty 2

## 2021-12-28 MED ORDER — PROPOFOL 500 MG/50ML IV EMUL
INTRAVENOUS | Status: AC
  Filled 2021-12-28: qty 50

## 2021-12-28 MED ORDER — KETOROLAC TROMETHAMINE 30 MG/ML IJ SOLN
INTRAMUSCULAR | Status: AC
  Filled 2021-12-28: qty 1

## 2021-12-28 MED ORDER — PROPOFOL 10 MG/ML IV BOLUS
INTRAVENOUS | Status: AC
  Filled 2021-12-28: qty 20

## 2021-12-28 SURGICAL SUPPLY — 47 items
APL SKNCLS STERI-STRIP NONHPOA (GAUZE/BANDAGES/DRESSINGS) ×2
BENZOIN TINCTURE PRP APPL 2/3 (GAUZE/BANDAGES/DRESSINGS) ×2 IMPLANT
BLADE EXTENDED COATED 6.5IN (ELECTRODE) IMPLANT
BLADE SURG 10 STRL SS (BLADE) IMPLANT
BRIEF MESH DISP LRG (UNDERPADS AND DIAPERS) ×1 IMPLANT
COVER BACK TABLE 60X90IN (DRAPES) ×1 IMPLANT
COVER MAYO STAND STRL (DRAPES) ×1 IMPLANT
DRAPE LAPAROTOMY 100X72 PEDS (DRAPES) ×1 IMPLANT
DRAPE UTILITY XL STRL (DRAPES) ×1 IMPLANT
ELECT REM PT RETURN 9FT ADLT (ELECTROSURGICAL) ×1
ELECTRODE REM PT RTRN 9FT ADLT (ELECTROSURGICAL) ×1 IMPLANT
GAUZE 4X4 16PLY ~~LOC~~+RFID DBL (SPONGE) ×1 IMPLANT
GAUZE PAD ABD 8X10 STRL (GAUZE/BANDAGES/DRESSINGS) ×1 IMPLANT
GAUZE SPONGE 4X4 12PLY STRL (GAUZE/BANDAGES/DRESSINGS) ×1 IMPLANT
GLOVE BIO SURGEON STRL SZ 6.5 (GLOVE) ×1 IMPLANT
GLOVE BIOGEL PI IND STRL 7.0 (GLOVE) ×1 IMPLANT
GLOVE INDICATOR 6.5 STRL GRN (GLOVE) ×1 IMPLANT
GOWN STRL REUS W/TWL XL LVL3 (GOWN DISPOSABLE) ×1 IMPLANT
HYDROGEN PEROXIDE 16OZ (MISCELLANEOUS) ×1 IMPLANT
IV CATH 14GX2 1/4 (CATHETERS) ×1 IMPLANT
IV CATH 18G SAFETY (IV SOLUTION) ×1 IMPLANT
KIT SIGMOIDOSCOPE (SET/KITS/TRAYS/PACK) IMPLANT
KIT TURNOVER CYSTO (KITS) ×1 IMPLANT
LOOP VESSEL MAXI BLUE (MISCELLANEOUS) IMPLANT
NEEDLE HYPO 22GX1.5 SAFETY (NEEDLE) ×1 IMPLANT
NS IRRIG 500ML POUR BTL (IV SOLUTION) ×1 IMPLANT
PACK BASIN DAY SURGERY FS (CUSTOM PROCEDURE TRAY) ×1 IMPLANT
PAD ARMBOARD 7.5X6 YLW CONV (MISCELLANEOUS) IMPLANT
PENCIL SMOKE EVACUATOR (MISCELLANEOUS) ×1 IMPLANT
SPIKE FLUID TRANSFER (MISCELLANEOUS) ×1 IMPLANT
SPONGE HEMORRHOID 8X3CM (HEMOSTASIS) IMPLANT
SPONGE SURGIFOAM ABS GEL 12-7 (HEMOSTASIS) IMPLANT
SUCTION FRAZIER HANDLE 10FR (MISCELLANEOUS)
SUCTION TUBE FRAZIER 10FR DISP (MISCELLANEOUS) IMPLANT
SUT CHROMIC 2 0 SH (SUTURE) IMPLANT
SUT CHROMIC 3 0 SH 27 (SUTURE) IMPLANT
SUT ETHIBOND 0 (SUTURE) IMPLANT
SUT VIC AB 2-0 SH 27 (SUTURE)
SUT VIC AB 2-0 SH 27XBRD (SUTURE) IMPLANT
SUT VIC AB 3-0 SH 18 (SUTURE) IMPLANT
SUT VIC AB 3-0 SH 27 (SUTURE)
SUT VIC AB 3-0 SH 27XBRD (SUTURE) IMPLANT
SYR CONTROL 10ML LL (SYRINGE) ×1 IMPLANT
TOWEL OR 17X26 10 PK STRL BLUE (TOWEL DISPOSABLE) ×1 IMPLANT
TRAY DSU PREP LF (CUSTOM PROCEDURE TRAY) ×1 IMPLANT
TUBE CONNECTING 12X1/4 (SUCTIONS) ×1 IMPLANT
YANKAUER SUCT BULB TIP NO VENT (SUCTIONS) ×1 IMPLANT

## 2021-12-28 NOTE — Interval H&P Note (Signed)
History and Physical Interval Note:  12/28/2021 10:42 AM  Jordan Olson  has presented today for surgery, with the diagnosis of ANAL ABSCESS.  The various methods of treatment have been discussed with the patient and family. After consideration of risks, benefits and other options for treatment, the patient has consented to  Procedure(s) with comments: ANAL EXAM UNDER ANESTHESIA, POSSIBLE I&D (N/A) - MAC as a surgical intervention.  The patient's history has been reviewed, patient examined, no change in status, stable for surgery.  I have reviewed the patient's chart and labs.  Questions were answered to the patient's satisfaction.    Rosario Adie, MD  [facility]

## 2021-12-28 NOTE — Discharge Instructions (Addendum)
Beginning the day after surgery:  You may sit in a tub of warm water 2-3 times a day to relieve discomfort.  Eat a regular diet high in fiber.  Avoid foods that give you constipation or diarrhea.  Avoid foods that are difficult to digest, such as seeds, nuts, corn or popcorn.  Do not go any longer than 2 days without a bowel movement.  You may take a dose of Milk of Magnesia if you become constipated.    Drink 6-8 glasses of water daily.  Walking is encouraged.  Avoid strenuous activity and heavy lifting for one month after surgery.    Call the office if you have any questions or concerns.  Call immediately if you develop:  Excessive rectal bleeding (more than a cup or passing large clots) Increased discomfort Fever greater than 100 F Difficulty urinating      No acetaminophen/Tylenol until after 5:00 pm today if needed.  No ibuprofen, Advil, Aleve, Motrin, ketorolac, meloxicam, naproxen, or other NSAIDS until after 7:00 pm today if needed.     Post Anesthesia Home Care Instructions  Activity: Get plenty of rest for the remainder of the day. A responsible individual must stay with you for 24 hours following the procedure.  For the next 24 hours, DO NOT: -Drive a car -Advertising copywriter -Drink alcoholic beverages -Take any medication unless instructed by your physician -Make any legal decisions or sign important papers.  Meals: Start with liquid foods such as gelatin or soup. Progress to regular foods as tolerated. Avoid greasy, spicy, heavy foods. If nausea and/or vomiting occur, drink only clear liquids until the nausea and/or vomiting subsides. Call your physician if vomiting continues.  Special Instructions/Symptoms: Your throat may feel dry or sore from the anesthesia or the breathing tube placed in your throat during surgery. If this causes discomfort, gargle with warm salt water. The discomfort should disappear within 24 hours.

## 2021-12-28 NOTE — Anesthesia Preprocedure Evaluation (Addendum)
Anesthesia Evaluation  Patient identified by MRN, date of birth, ID band Patient awake    Reviewed: Allergy & Precautions, H&P , NPO status , Patient's Chart, lab work & pertinent test results  Airway Mallampati: I  TM Distance: >3 FB Neck ROM: Full    Dental no notable dental hx. (+) Teeth Intact, Dental Advisory Given   Pulmonary neg pulmonary ROS   Pulmonary exam normal breath sounds clear to auscultation       Cardiovascular negative cardio ROS  Rhythm:Regular Rate:Normal     Neuro/Psych  Headaches  negative psych ROS   GI/Hepatic negative GI ROS, Neg liver ROS,,,  Endo/Other  negative endocrine ROS    Renal/GU negative Renal ROS  negative genitourinary   Musculoskeletal   Abdominal   Peds  Hematology negative hematology ROS (+)   Anesthesia Other Findings   Reproductive/Obstetrics negative OB ROS                             Anesthesia Physical Anesthesia Plan  ASA: 2  Anesthesia Plan: MAC   Post-op Pain Management: Tylenol PO (pre-op)* and Toradol IV (intra-op)*   Induction: Intravenous  PONV Risk Score and Plan: 4 or greater and Ondansetron, Dexamethasone, Treatment may vary due to age or medical condition and Propofol infusion  Airway Management Planned: Natural Airway and Simple Face Mask  Additional Equipment:   Intra-op Plan:   Post-operative Plan:   Informed Consent: I have reviewed the patients History and Physical, chart, labs and discussed the procedure including the risks, benefits and alternatives for the proposed anesthesia with the patient or authorized representative who has indicated his/her understanding and acceptance.     Dental advisory given  Plan Discussed with: CRNA  Anesthesia Plan Comments:        Anesthesia Quick Evaluation

## 2021-12-28 NOTE — Op Note (Signed)
12/28/2021  12:42 PM  PATIENT:  Jordan Olson  61 y.o. female  Patient Care Team: Laurey Morale, MD as PCP - General  PRE-OPERATIVE DIAGNOSIS:  ANAL ABSCESS  POST-OPERATIVE DIAGNOSIS:  INTERSPHINCTERIC ABSCESS, ANAL CANAL PAPILLA  PROCEDURE:  ANAL EXAM UNDER ANESTHESIA, DEBRIDEMENT ANAL CANAL ABSCESS, EXCISION OF ANAL CANAL MASS   Surgeon(s): Leighton Ruff, MD  ASSISTANT: none   ANESTHESIA:   local and MAC  SPECIMEN:  Source of Specimen:  anterior anal canal mass  DISPOSITION OF SPECIMEN:  PATHOLOGY  COUNTS:  YES  PLAN OF CARE: Discharge to home after PACU  PATIENT DISPOSITION:  PACU - hemodynamically stable.  INDICATION: 61 y.o. F with perianal mass, unable to drain in the office.  MRI shows an intersphincteric fluid collection, concerning for fistula   OR FINDINGS: anterior midline anal papilla, R anterior intersphincteric abscess, spontaneously draining  DESCRIPTION: the patient was identified in the preoperative holding area and taken to the OR where they were laid on the operating room table.  MAC anesthesia was induced without difficulty. The patient was then positioned in prone jackknife position with buttocks gently taped apart.  The patient was then prepped and draped in usual sterile fashion.  SCDs were noted to be in place prior to the initiation of anesthesia. A surgical timeout was performed indicating the correct patient, procedure, positioning and need for preoperative antibiotics.  A rectal block was performed using Marcaine with epinephrine.    I began with a digital rectal exam.  There was an obvious mass in the anterior anal canal, which was pedunculated. I then placed a Hill-Ferguson anoscope into the anal canal and evaluated this completely.  She appeared to have an anal papilla at anterior midline.  There was approximately 1 cm opening in the anal canal and the right anterior region by presumed to be at the intersphincteric abscess that has  spontaneously drained.  The remaining mucosa was debrided away from this area so that it can drain completely and seal.  The anterior anal canal mass, which was thought to be an anal papilla was removed using Metzenbaum scissors.  The biopsy site was closed using an interrupted 3-0 chromic suture.  The debridement was carried to the R anterior skin tag.  This site was closed with interrupted 3-0 Chromic sutures.    Additional Marcaine was placed to be mildly helped resection of the sites.  The patient was evaluated by anesthesia and sent to the postanesthesia care unit in stable condition.  All counts were correct per operating room staff.  Rosario Adie, MD  Colorectal [facility]

## 2021-12-28 NOTE — Anesthesia Postprocedure Evaluation (Signed)
Anesthesia Post Note  Patient: Jordan Olson  Procedure(s) Performed: ANAL EXAM UNDER ANESTHESIA, EXCISION OF ANAL CANAL MASS, DEBRIDEMENT OF ANAL CANAL ABSCESS (Rectum)     Patient location during evaluation: PACU Anesthesia Type: General Level of consciousness: awake and alert Pain management: pain level controlled Vital Signs Assessment: post-procedure vital signs reviewed and stable Respiratory status: spontaneous breathing, nonlabored ventilation and respiratory function stable Cardiovascular status: stable and blood pressure returned to baseline Postop Assessment: no apparent nausea or vomiting Anesthetic complications: no  No notable events documented.  Last Vitals:  Vitals:   12/28/21 1330 12/28/21 1345  BP: 123/82 123/82  Pulse: 71 61  Resp: 14 16  Temp:  36.5 C  SpO2: 100% 100%    Last Pain:  Vitals:   12/28/21 1345  TempSrc:   PainSc: 0-No pain                 Jace Dowe,W. EDMOND

## 2021-12-28 NOTE — Transfer of Care (Signed)
Immediate Anesthesia Transfer of Care Note  Patient: Jordan Olson  Procedure(s) Performed: ANAL EXAM UNDER ANESTHESIA, EXCISION OF ANAL CANAL MASS, DEBRIDEMENT OF ANAL CANAL ABSCESS (Rectum)  Patient Location: PACU  Anesthesia Type:MAC  Level of Consciousness: awake, alert , oriented, and patient cooperative  Airway & Oxygen Therapy: Patient Spontanous Breathing  Post-op Assessment: Report given to RN and Post -op Vital signs reviewed and stable  Post vital signs: Reviewed and stable  Last Vitals:  Vitals Value Taken Time  BP 112/73 12/28/21 1311  Temp    Pulse 77 12/28/21 1312  Resp 12 12/28/21 1312  SpO2 95 % 12/28/21 1312  Vitals shown include unvalidated device data.  Last Pain:  Vitals:   12/28/21 1100  TempSrc: Oral  PainSc: 0-No pain      Patients Stated Pain Goal: 3 (74/16/38 4536)  Complications: No notable events documented.

## 2021-12-29 ENCOUNTER — Encounter (HOSPITAL_BASED_OUTPATIENT_CLINIC_OR_DEPARTMENT_OTHER): Payer: Self-pay | Admitting: General Surgery

## 2021-12-29 LAB — SURGICAL PATHOLOGY

## 2021-12-31 ENCOUNTER — Other Ambulatory Visit: Payer: Commercial Managed Care - HMO

## 2022-02-14 ENCOUNTER — Ambulatory Visit: Payer: Self-pay | Admitting: General Surgery

## 2022-02-14 NOTE — H&P (View-Only) (Signed)
 PROVIDER:  Tomoki Lucken Jordan Saxon Barich, MD   MRN: [id number] DOB: 07/01/1960 DATE OF ENCOUNTER: 02/14/2022 Interval History:    61-year-old female status post exam under anesthesia and debridement of intersphincteric abscess.  She has had some pain after surgery followed by a release of purulence.  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  We placed her on a fiber supplement to see if we can get the internal portion of this to heal enough to close down some.  She returns today for reevaluation.  She does still have some drainage, but she has very little pain.  Past Medical History:  Diagnosis Date   Anal abscess    Chronic constipation    Family history of adverse reaction to anesthesia    per pt her mother, mother's brother and sister have memory issues   Hemorrhoids    History of Lyme disease 2006   treated   (12-25-2021  per pt no residual's)   Migraines    Wears glasses    Past Surgical History:  Procedure Laterality Date   APPENDECTOMY  1990   BLEPHAROPLASTY W/ LASER Bilateral 11/2012   upper eyelid   BREAST BIOPSY Bilateral    Right 2011 Left 2014   COLONOSCOPY  08/2017   per Dr. Sam Ganem, internal hemorrhoids, no polyps, repeat in 10 yrs    RECTAL EXAM UNDER ANESTHESIA N/A 12/28/2021   Procedure: ANAL EXAM UNDER ANESTHESIA, EXCISION OF ANAL CANAL MASS, DEBRIDEMENT OF ANAL CANAL ABSCESS;  Surgeon: Madelynne Lasker, MD;  Location: [facility];  Service: General;  Laterality: N/A;  MAC   TONSILLECTOMY     age 5   Family History  Problem Relation Age of Onset   Breast cancer Cousin 32   Colon cancer Maternal Grandmother    Diabetes Paternal Grandmother    Social History   Socioeconomic History   Marital status: Married    Spouse name: Not on file   Number of children: Not on file   Years of education: Not on file   Highest education level: Not on file  Occupational History   Not on file  Tobacco Use    Smoking status: Never   Smokeless tobacco: Never  Vaping Use   Vaping Use: Never used  Substance and Sexual Activity   Alcohol use: Yes    Alcohol/week: 7.0 standard drinks of alcohol    Types: 7 Glasses of wine per week    Comment: glass wine daily   Drug use: Never   Sexual activity: Yes    Partners: Male    Birth control/protection: Post-menopausal    Comment: Married   Other Topics Concern   Not on file  Social History Narrative   Not on file   Social Determinants of Health   Financial Resource Strain: Not on file  Food Insecurity: Not on file  Transportation Needs: Not on file  Physical Activity: Not on file  Stress: Not on file  Social Connections: Not on file  Intimate Partner Violence: Not on file    Current Outpatient Medications:    butalbital-apap-caffeine-codeine (FIORICET WITH CODEINE) 50-325-40-30 MG capsule, Take 1 capsule by mouth every 4 (four) hours as needed for headache. (Patient taking differently: Take 1 capsule by mouth every 4 (four) hours as needed for headache.), Disp: 60 capsule, Rfl: 0   ezetimibe (ZETIA) 10 MG tablet, Take 1 tablet by mouth daily., Disp: 90 tablet, Rfl: 1   naproxen sodium (ALEVE) 220 MG   tablet, Take 220 mg by mouth 2 (two) times daily as needed., Disp: , Rfl:    Polyethylene Glycol 3350 (MIRALAX PO), Take by mouth daily., Disp: , Rfl:    saccharomyces boulardii (FLORASTOR) 250 MG capsule, Take 250 mg by mouth daily., Disp: , Rfl:    traMADol (ULTRAM) 50 MG tablet, Take 1 tablet (50 mg total) by mouth every 6 (six) hours as needed for moderate pain., Disp: 10 tablet, Rfl: 0 No Known Allergies Review of Systems - Negative except as stated above  Physical Examination:    Gen: NAD Incision: small pinhole in L anterior perianal region draining purulence.   Rectal: Defect internally is closed.  There now appears to be just a skin bridge over the fistula.  I do not palpate much muscle involvement   Assessment and Plan:    Jordan  Olson is a 61 y.o. female who underwent EUA and debridement of intersphincteric abscess.  She then developed a small pinhole fistula at the site of her intersphincteric abscess cavity.  It appears her internal sphincter has closed.  She still has a continued fistula within the skin overlying the sphincter complex.  I have recommended a simple fistulotomy for this.  All questions were answered. Diagnoses and all orders for this visit:   Anal fistula           No follow-ups on file.     The plan was discussed in detail with the patient today, who expressed understanding.  The patient has my contact information, and understands to call me with any additional questions or concerns in the interval.  I would be happy to see the patient back sooner if the need arises.    Masha Orbach C Gearld Kerstein, MD Colon and Rectal Surgery [facility]  

## 2022-02-14 NOTE — H&P (Signed)
PROVIDER:  Monico Blitz, MD   MRN: [id number] DOB: 18-Sep-1960 DATE OF ENCOUNTER: 02/14/2022 Interval History:    62 year old female status post exam under anesthesia and debridement of intersphincteric abscess.  She has had some pain after surgery followed by a release of purulence.  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  We placed her on a fiber supplement to see if we can get the internal portion of this to heal enough to close down some.  She returns today for reevaluation.  She does still have some drainage, but she has very little pain.  Past Medical History:  Diagnosis Date   Anal abscess    Chronic constipation    Family history of adverse reaction to anesthesia    per pt her mother, mother's brother and sister have memory issues   Hemorrhoids    History of Lyme disease 2006   treated   (12-25-2021  per pt no residual's)   Migraines    Wears glasses    Past Surgical History:  Procedure Laterality Date   APPENDECTOMY  1990   BLEPHAROPLASTY W/ LASER Bilateral 11/2012   upper eyelid   BREAST BIOPSY Bilateral    Right 2011 Left 2014   COLONOSCOPY  08/2017   per Dr. Acquanetta Sit, internal hemorrhoids, no polyps, repeat in 10 yrs    RECTAL EXAM UNDER ANESTHESIA N/A 12/28/2021   Procedure: ANAL EXAM UNDER ANESTHESIA, EXCISION OF ANAL CANAL MASS, DEBRIDEMENT OF ANAL CANAL ABSCESS;  Surgeon: Leighton Ruff, MD;  Location: [facility];  Service: General;  Laterality: N/A;  MAC   TONSILLECTOMY     age 58   Family History  Problem Relation Age of Onset   Breast cancer Cousin 32   Colon cancer Maternal Grandmother    Diabetes Paternal Grandmother    Social History   Socioeconomic History   Marital status: Married    Spouse name: Not on file   Number of children: Not on file   Years of education: Not on file   Highest education level: Not on file  Occupational History   Not on file  Tobacco Use    Smoking status: Never   Smokeless tobacco: Never  Vaping Use   Vaping Use: Never used  Substance and Sexual Activity   Alcohol use: Yes    Alcohol/week: 7.0 standard drinks of alcohol    Types: 7 Glasses of wine per week    Comment: glass wine daily   Drug use: Never   Sexual activity: Yes    Partners: Male    Birth control/protection: Post-menopausal    Comment: Married   Other Topics Concern   Not on file  Social History Narrative   Not on file   Social Determinants of Health   Financial Resource Strain: Not on file  Food Insecurity: Not on file  Transportation Needs: Not on file  Physical Activity: Not on file  Stress: Not on file  Social Connections: Not on file  Intimate Partner Violence: Not on file    Current Outpatient Medications:    butalbital-apap-caffeine-codeine (FIORICET WITH CODEINE) 50-325-40-30 MG capsule, Take 1 capsule by mouth every 4 (four) hours as needed for headache. (Patient taking differently: Take 1 capsule by mouth every 4 (four) hours as needed for headache.), Disp: 60 capsule, Rfl: 0   ezetimibe (ZETIA) 10 MG tablet, Take 1 tablet by mouth daily., Disp: 90 tablet, Rfl: 1   naproxen sodium (ALEVE) 220 MG  tablet, Take 220 mg by mouth 2 (two) times daily as needed., Disp: , Rfl:    Polyethylene Glycol 3350 (MIRALAX PO), Take by mouth daily., Disp: , Rfl:    saccharomyces boulardii (FLORASTOR) 250 MG capsule, Take 250 mg by mouth daily., Disp: , Rfl:    traMADol (ULTRAM) 50 MG tablet, Take 1 tablet (50 mg total) by mouth every 6 (six) hours as needed for moderate pain., Disp: 10 tablet, Rfl: 0 No Known Allergies Review of Systems - Negative except as stated above  Physical Examination:    Gen: NAD Incision: small pinhole in L anterior perianal region draining purulence.   Rectal: Defect internally is closed.  There now appears to be just a skin bridge over the fistula.  I do not palpate much muscle involvement   Assessment and Plan:    Jordan Olson is a 62 y.o. female who underwent EUA and debridement of intersphincteric abscess.  She then developed a small pinhole fistula at the site of her intersphincteric abscess cavity.  It appears her internal sphincter has closed.  She still has a continued fistula within the skin overlying the sphincter complex.  I have recommended a simple fistulotomy for this.  All questions were answered. Diagnoses and all orders for this visit:   Anal fistula           No follow-ups on file.     The plan was discussed in detail with the patient today, who expressed understanding.  The patient has my contact information, and understands to call me with any additional questions or concerns in the interval.  I would be happy to see the patient back sooner if the need arises.    Rosario Adie, MD Colon and Rectal Surgery [facility]

## 2022-02-17 IMAGING — MG MM DIGITAL SCREENING BILAT W/ TOMO AND CAD
8 series · 9 of 24 positions shown · non-contrast
Comparison: Previous exam(s).

CLINICAL DATA: Screening.

EXAM:
DIGITAL SCREENING BILATERAL MAMMOGRAM WITH TOMOSYNTHESIS AND CAD
TECHNIQUE: Bilateral screening digital craniocaudal and mediolateral oblique
mammograms were obtained. Bilateral screening digital breast
tomosynthesis was performed. The images were evaluated with
computer-aided detection.

[R MLO synth-2D]
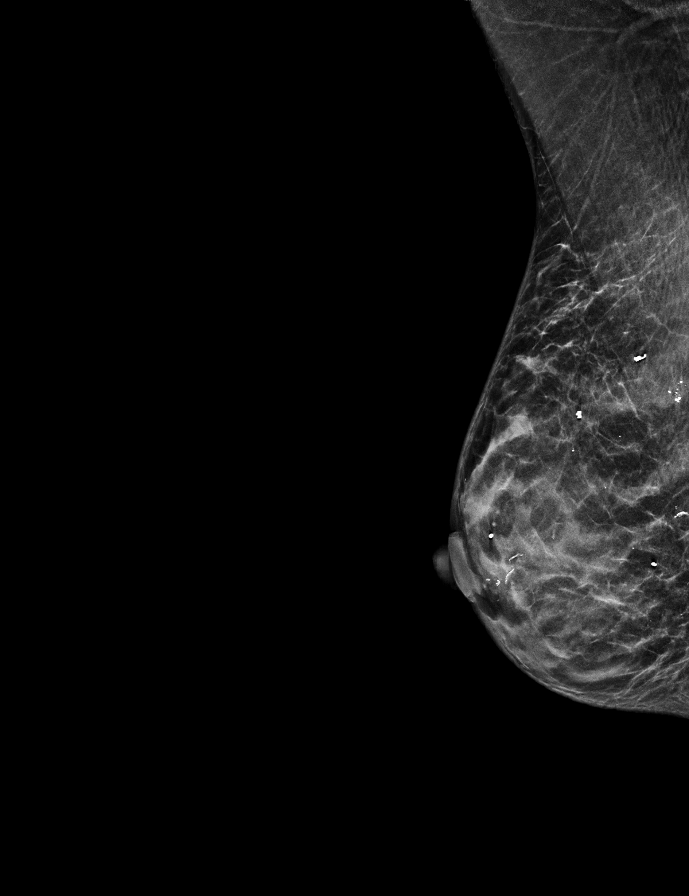

[L CC synth-2D]
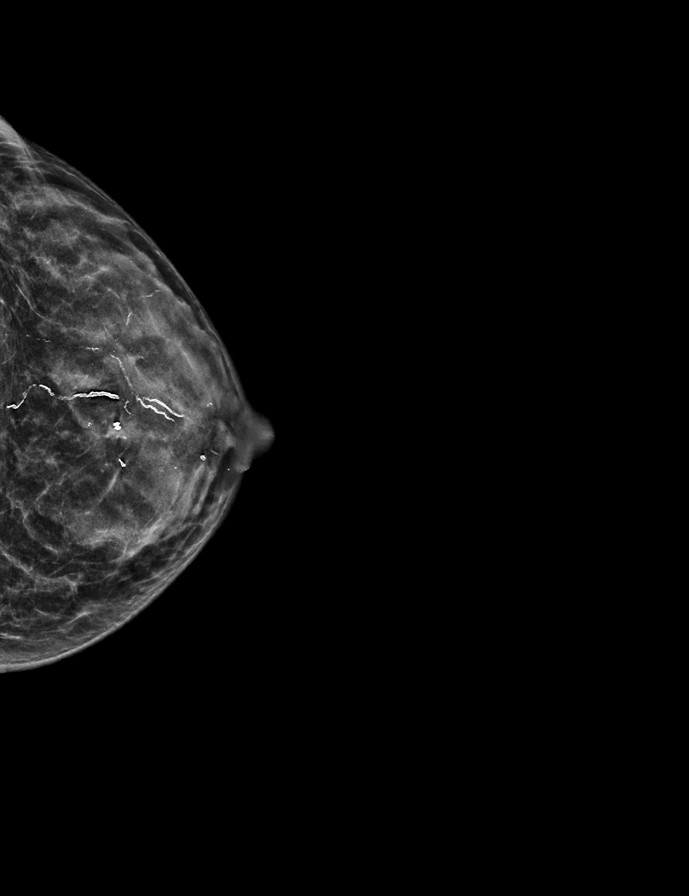

[R CC synth-2D]
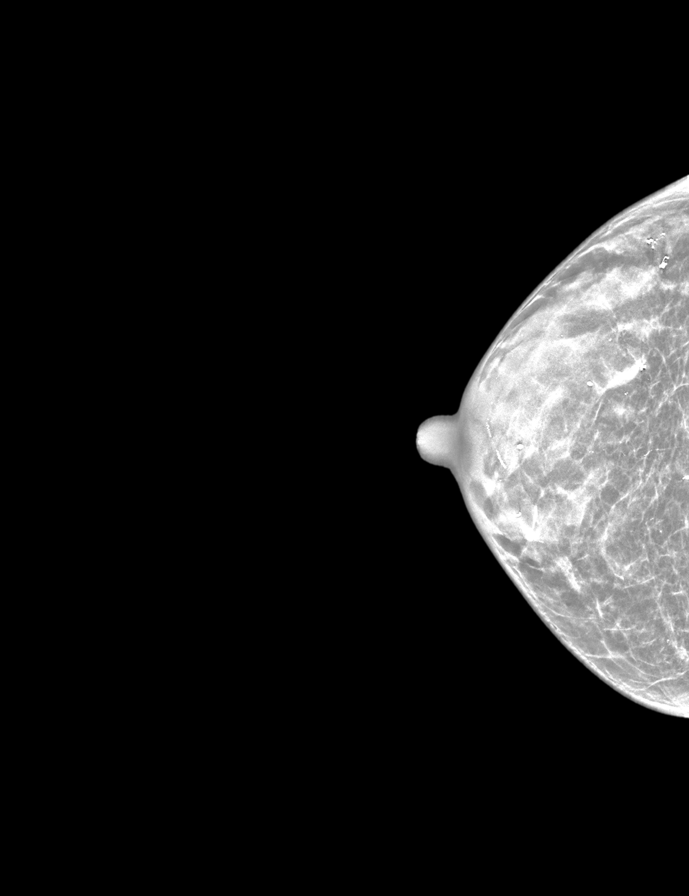

[L MLO synth-2D]
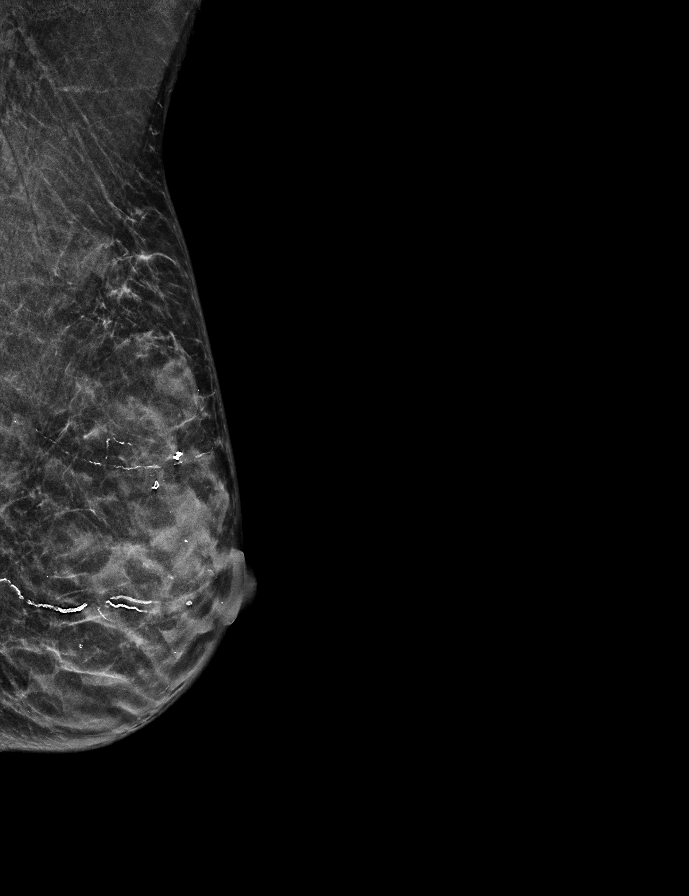

[L CC tomo · 2 of 43 frames shown]
[frame 14/43]
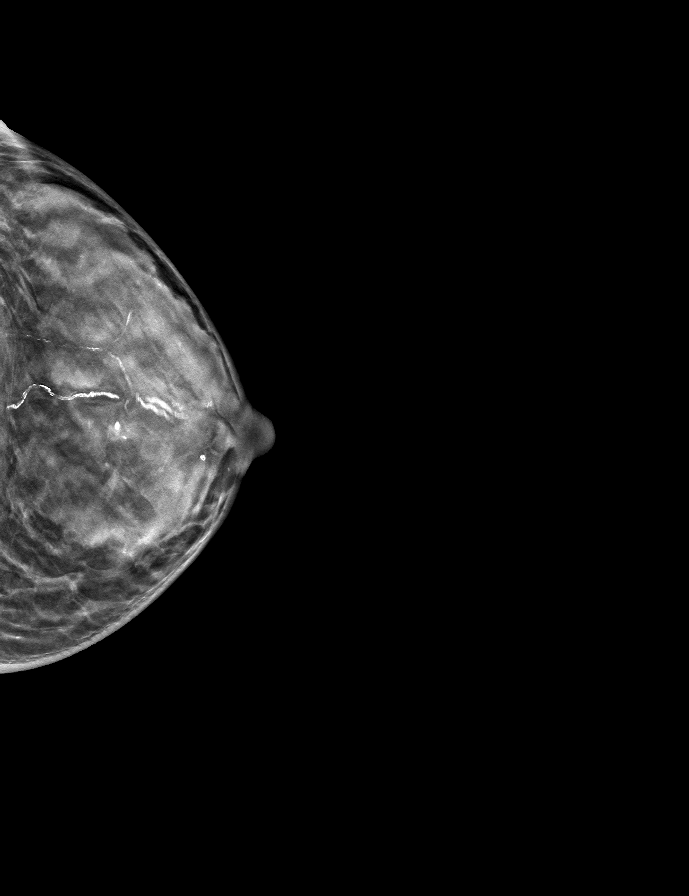
[frame 22/43]
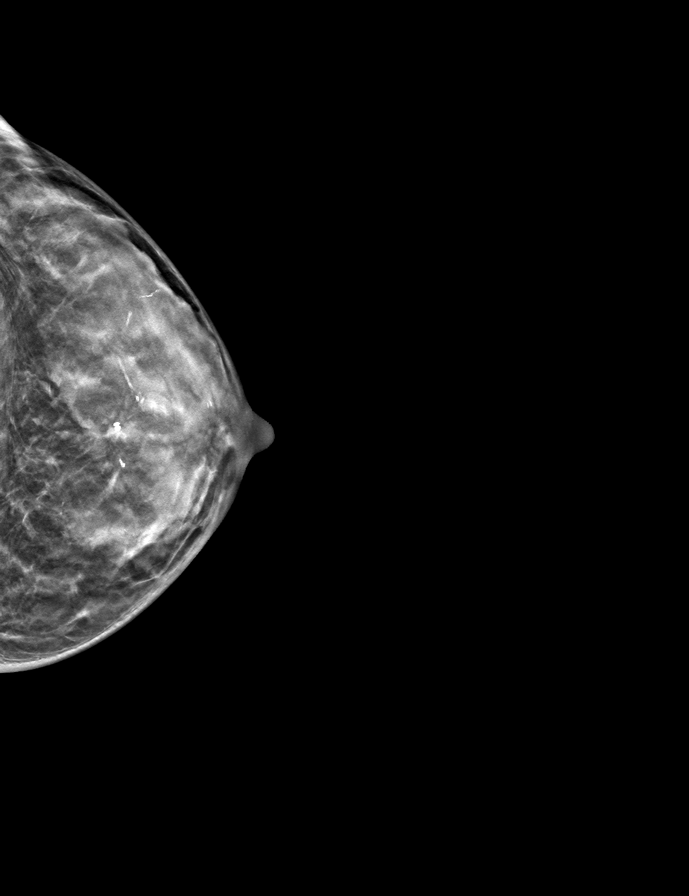

[R MLO tomo · tomo slice 20/39.0]
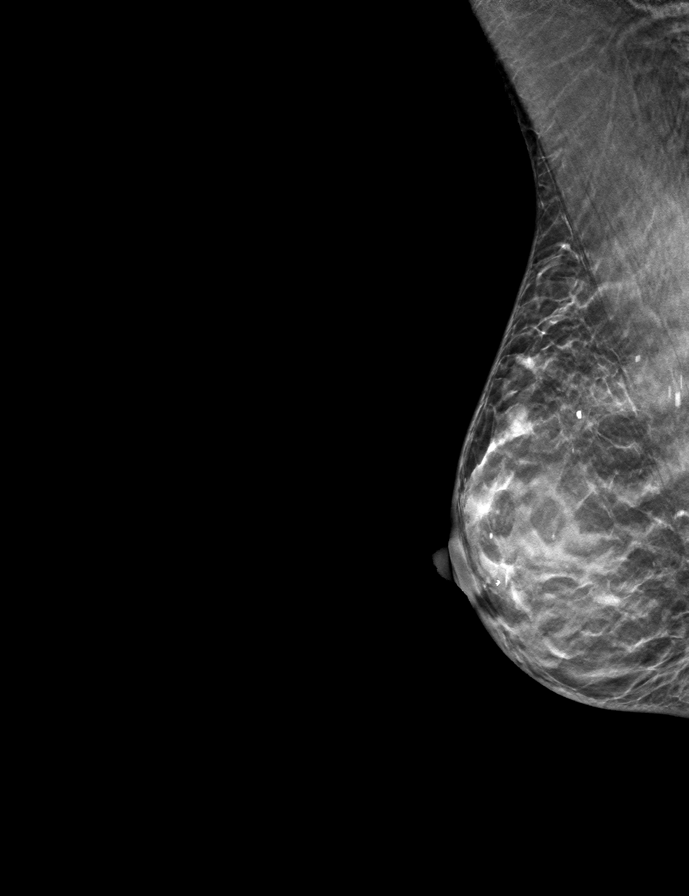

[R CC tomo · tomo slice 19/38.0]
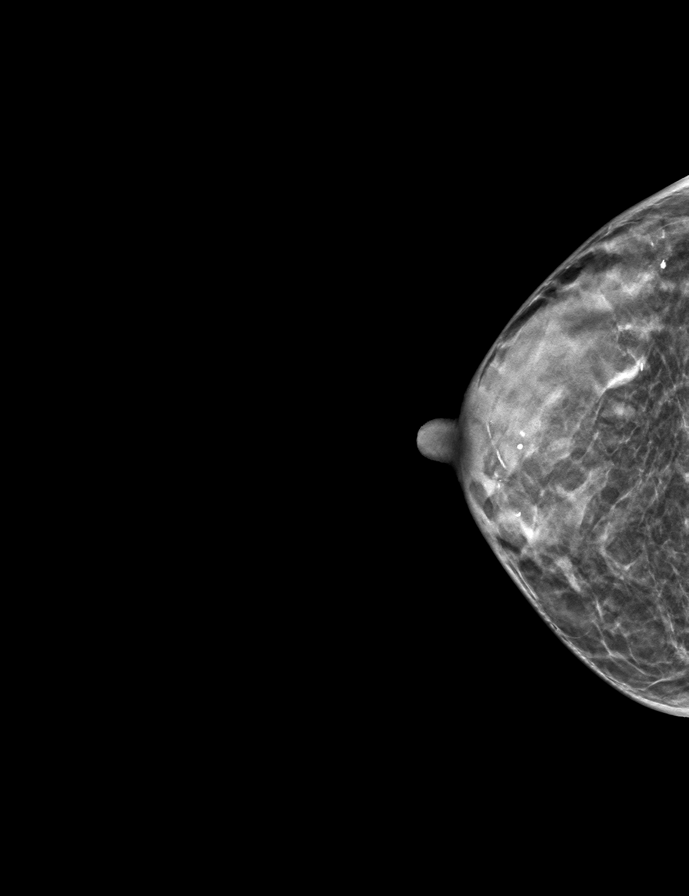

[L MLO tomo · tomo slice 19/38.0]
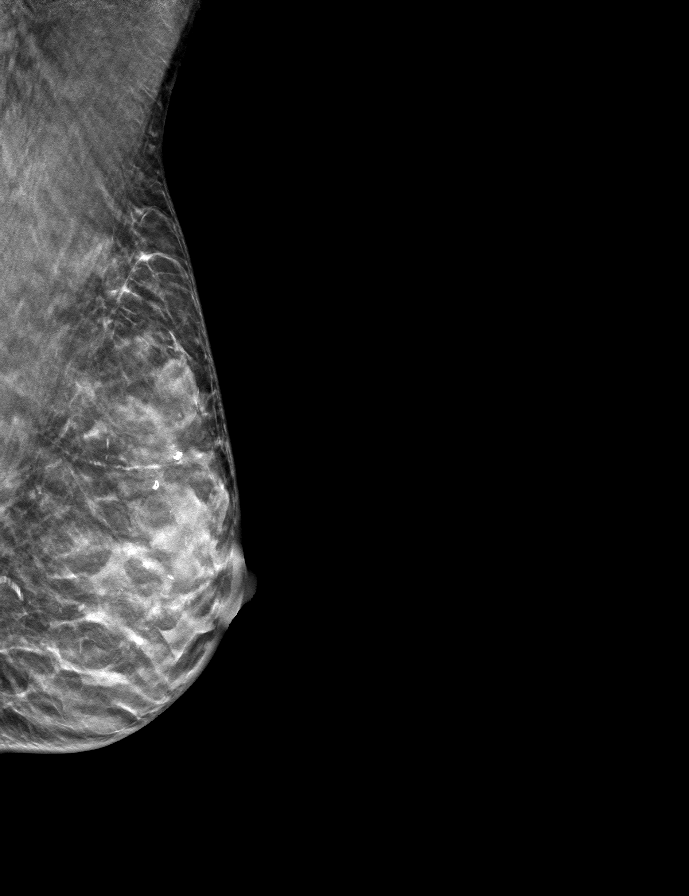

[9 of 24 positions shown; findings below may reference images not displayed]

ACR Breast Density Category d: The breast tissue is extremely dense,
which lowers the sensitivity of mammography
FINDINGS: There are no findings suspicious for malignancy.
IMPRESSION: No mammographic evidence of malignancy. A result letter of this
screening mammogram will be mailed directly to the patient.

RECOMMENDATION:
Screening mammogram in one year. (Code:TA-V-WV9)

BI-RADS CATEGORY  1: Negative.

## 2022-03-06 ENCOUNTER — Encounter (HOSPITAL_BASED_OUTPATIENT_CLINIC_OR_DEPARTMENT_OTHER): Payer: Self-pay | Admitting: General Surgery

## 2022-03-06 NOTE — Progress Notes (Signed)
Spoke w/ via phone for pre-op interview---[facility] Lab needs dos----  NONE             Lab results------ COVID test -----patient states asymptomatic no test needed Arrive at -------0700 NPO after MN NO Solid Food.   Med rec completed Medications to take morning of surgery -----NONE Diabetic medication ----- Patient instructed no nail polish to be worn day of surgery Patient instructed to bring photo id and insurance card day of surgery Patient aware to have Driver (ride ) / caregiver Husband Jordan Olson   for 24 hours after surgery  Patient Special Instructions ----- Pre-Op special Istructions ----- Patient verbalized understanding of instructions that were given at this phone interview. Patient denies shortness of breath, chest pain, fever, cough at this phone interview.

## 2022-03-07 ENCOUNTER — Other Ambulatory Visit: Payer: Self-pay | Admitting: Family Medicine

## 2022-03-16 ENCOUNTER — Ambulatory Visit (HOSPITAL_BASED_OUTPATIENT_CLINIC_OR_DEPARTMENT_OTHER): Payer: Commercial Managed Care - HMO | Admitting: Certified Registered"

## 2022-03-16 ENCOUNTER — Encounter (HOSPITAL_BASED_OUTPATIENT_CLINIC_OR_DEPARTMENT_OTHER): Payer: Self-pay | Admitting: General Surgery

## 2022-03-16 ENCOUNTER — Encounter (HOSPITAL_BASED_OUTPATIENT_CLINIC_OR_DEPARTMENT_OTHER)
Admission: RE | Disposition: A | Discharge status: Home / Self Care | Payer: Self-pay | Source: Ambulatory Visit | Attending: General Surgery

## 2022-03-16 ENCOUNTER — Ambulatory Visit (HOSPITAL_BASED_OUTPATIENT_CLINIC_OR_DEPARTMENT_OTHER)
Admission: RE | Admit: 2022-03-16 | Discharge: 2022-03-16 | Disposition: A | Discharge status: Home / Self Care | Payer: Commercial Managed Care - HMO | Source: Ambulatory Visit | Attending: General Surgery | Admitting: General Surgery

## 2022-03-16 DIAGNOSIS — K603 Anal fistula: Secondary | ICD-10-CM | POA: Insufficient documentation

## 2022-03-16 HISTORY — PX: FISTULOTOMY: SHX6413

## 2022-03-16 SURGERY — FISTULOTOMY
Anesthesia: Monitor Anesthesia Care | Site: Rectum

## 2022-03-16 MED ORDER — OXYCODONE HCL 5 MG PO TABS: 5.0000 mg | ORAL_TABLET | Freq: Once | ORAL | Status: DC | PRN

## 2022-03-16 MED ORDER — SODIUM CHLORIDE 0.9% FLUSH: 3.0000 mL | Freq: Two times a day (BID) | INTRAVENOUS | Status: DC

## 2022-03-16 MED ORDER — PROPOFOL 10 MG/ML IV BOLUS
INTRAVENOUS | Status: DC | PRN
  Administered 2022-03-16: 20 mg via INTRAVENOUS

## 2022-03-16 MED ORDER — PHENYLEPHRINE 80 MCG/ML (10ML) SYRINGE FOR IV PUSH (FOR BLOOD PRESSURE SUPPORT)
PREFILLED_SYRINGE | INTRAVENOUS | Status: DC | PRN
  Administered 2022-03-16: 80 ug via INTRAVENOUS

## 2022-03-16 MED ORDER — FENTANYL CITRATE (PF) 100 MCG/2ML IJ SOLN
INTRAMUSCULAR | Status: AC
  Filled 2022-03-16: qty 2

## 2022-03-16 MED ORDER — MIDAZOLAM HCL 2 MG/2ML IJ SOLN
INTRAMUSCULAR | Status: AC
  Filled 2022-03-16: qty 2

## 2022-03-16 MED ORDER — 0.9 % SODIUM CHLORIDE (POUR BTL) OPTIME
TOPICAL | Status: DC | PRN
  Administered 2022-03-16: 500 mL

## 2022-03-16 MED ORDER — MIDAZOLAM HCL 2 MG/2ML IJ SOLN
INTRAMUSCULAR | Status: DC | PRN
  Administered 2022-03-16: 1 mg via INTRAVENOUS

## 2022-03-16 MED ORDER — PROPOFOL 1000 MG/100ML IV EMUL
INTRAVENOUS | Status: AC
  Filled 2022-03-16: qty 200

## 2022-03-16 MED ORDER — FENTANYL CITRATE (PF) 100 MCG/2ML IJ SOLN: 25.0000 ug | INTRAMUSCULAR | Status: DC | PRN

## 2022-03-16 MED ORDER — BUPIVACAINE-EPINEPHRINE 0.5% -1:200000 IJ SOLN
INTRAMUSCULAR | Status: DC | PRN
  Administered 2022-03-16: 30 mL

## 2022-03-16 MED ORDER — PROPOFOL 500 MG/50ML IV EMUL
INTRAVENOUS | Status: AC
  Filled 2022-03-16: qty 50

## 2022-03-16 MED ORDER — LACTATED RINGERS IV SOLN: INTRAVENOUS | Status: DC

## 2022-03-16 MED ORDER — FENTANYL CITRATE (PF) 250 MCG/5ML IJ SOLN
INTRAMUSCULAR | Status: DC | PRN
  Administered 2022-03-16 (×2): 25 ug via INTRAVENOUS

## 2022-03-16 MED ORDER — OXYCODONE HCL 5 MG/5ML PO SOLN: 5.0000 mg | Freq: Once | ORAL | Status: DC | PRN

## 2022-03-16 MED ORDER — LIDOCAINE 2% (20 MG/ML) 5 ML SYRINGE
INTRAMUSCULAR | Status: DC | PRN
  Administered 2022-03-16: 40 mg via INTRAVENOUS

## 2022-03-16 MED ORDER — ONDANSETRON HCL 4 MG/2ML IJ SOLN
INTRAMUSCULAR | Status: AC
  Filled 2022-03-16: qty 2

## 2022-03-16 MED ORDER — ACETAMINOPHEN 500 MG PO TABS
ORAL_TABLET | ORAL | Status: AC
  Filled 2022-03-16: qty 2

## 2022-03-16 MED ORDER — AMISULPRIDE (ANTIEMETIC) 5 MG/2ML IV SOLN: 10.0000 mg | Freq: Once | INTRAVENOUS | Status: DC | PRN

## 2022-03-16 MED ORDER — ONDANSETRON HCL 4 MG/2ML IJ SOLN
INTRAMUSCULAR | Status: DC | PRN
  Administered 2022-03-16: 4 mg via INTRAVENOUS

## 2022-03-16 MED ORDER — ACETAMINOPHEN 500 MG PO TABS
1000.0000 mg | ORAL_TABLET | ORAL | Status: AC
  Administered 2022-03-16: 1000 mg via ORAL

## 2022-03-16 MED ORDER — PROPOFOL 500 MG/50ML IV EMUL
INTRAVENOUS | Status: DC | PRN
  Administered 2022-03-16: 150 ug/kg/min via INTRAVENOUS

## 2022-03-16 SURGICAL SUPPLY — 53 items
APL SKNCLS STERI-STRIP NONHPOA (GAUZE/BANDAGES/DRESSINGS) ×2
BENZOIN TINCTURE PRP APPL 2/3 (GAUZE/BANDAGES/DRESSINGS) ×2 IMPLANT
BLADE EXTENDED COATED 6.5IN (ELECTRODE) IMPLANT
BLADE SURG 10 STRL SS (BLADE) IMPLANT
BRIEF MESH DISP LRG (UNDERPADS AND DIAPERS) ×1 IMPLANT
COVER BACK TABLE 60X90IN (DRAPES) ×1 IMPLANT
COVER MAYO STAND STRL (DRAPES) ×1 IMPLANT
DRAPE HYSTEROSCOPY (MISCELLANEOUS) IMPLANT
DRAPE LAPAROTOMY 100X72 PEDS (DRAPES) ×1 IMPLANT
DRAPE SHEET LG 3/4 BI-LAMINATE (DRAPES) IMPLANT
DRAPE UTILITY XL STRL (DRAPES) ×1 IMPLANT
ELECT REM PT RETURN 9FT ADLT (ELECTROSURGICAL) ×1
ELECTRODE REM PT RTRN 9FT ADLT (ELECTROSURGICAL) ×1 IMPLANT
GAUZE 4X4 16PLY ~~LOC~~+RFID DBL (SPONGE) ×1 IMPLANT
GAUZE PAD ABD 8X10 STRL (GAUZE/BANDAGES/DRESSINGS) ×1 IMPLANT
GAUZE SPONGE 4X4 12PLY STRL (GAUZE/BANDAGES/DRESSINGS) ×1 IMPLANT
GLOVE BIO SURGEON STRL SZ 6.5 (GLOVE) ×1 IMPLANT
GLOVE INDICATOR 6.5 STRL GRN (GLOVE) ×1 IMPLANT
GOWN STRL REUS W/TWL XL LVL3 (GOWN DISPOSABLE) ×1 IMPLANT
HYDROGEN PEROXIDE 16OZ (MISCELLANEOUS) ×1 IMPLANT
IV CATH 14GX2 1/4 (CATHETERS) ×1 IMPLANT
IV CATH 18G SAFETY (IV SOLUTION) ×1 IMPLANT
KIT SIGMOIDOSCOPE (SET/KITS/TRAYS/PACK) IMPLANT
KIT TURNOVER CYSTO (KITS) ×1 IMPLANT
LEGGING LITHOTOMY PAIR STRL (DRAPES) IMPLANT
LOOP VASCLR MAXI BLUE 18IN ST (MISCELLANEOUS) IMPLANT
LOOP VASCULAR MAXI 18 BLUE (MISCELLANEOUS)
LOOPS VASCLR MAXI BLUE 18IN ST (MISCELLANEOUS) IMPLANT
NEEDLE HYPO 22GX1.5 SAFETY (NEEDLE) ×1 IMPLANT
NS IRRIG 500ML POUR BTL (IV SOLUTION) ×1 IMPLANT
PACK BASIN DAY SURGERY FS (CUSTOM PROCEDURE TRAY) ×1 IMPLANT
PAD ARMBOARD 7.5X6 YLW CONV (MISCELLANEOUS) IMPLANT
PENCIL SMOKE EVACUATOR (MISCELLANEOUS) ×1 IMPLANT
SLEEVE SCD COMPRESS KNEE MED (STOCKING) ×1 IMPLANT
SPIKE FLUID TRANSFER (MISCELLANEOUS) ×1 IMPLANT
SPONGE HEMORRHOID 8X3CM (HEMOSTASIS) IMPLANT
SPONGE SURGIFOAM ABS GEL 12-7 (HEMOSTASIS) IMPLANT
SUCTION FRAZIER HANDLE 10FR (MISCELLANEOUS)
SUCTION TUBE FRAZIER 10FR DISP (MISCELLANEOUS) IMPLANT
SUT CHROMIC 2 0 SH (SUTURE) IMPLANT
SUT CHROMIC 3 0 SH 27 (SUTURE) IMPLANT
SUT ETHIBOND 0 (SUTURE) IMPLANT
SUT VIC AB 2-0 SH 27 (SUTURE)
SUT VIC AB 2-0 SH 27XBRD (SUTURE) IMPLANT
SUT VIC AB 3-0 SH 18 (SUTURE) IMPLANT
SUT VIC AB 3-0 SH 27 (SUTURE)
SUT VIC AB 3-0 SH 27XBRD (SUTURE) IMPLANT
SYR CONTROL 10ML LL (SYRINGE) ×1 IMPLANT
TOWEL OR 17X26 10 PK STRL BLUE (TOWEL DISPOSABLE) ×1 IMPLANT
TRAY DSU PREP LF (CUSTOM PROCEDURE TRAY) ×1 IMPLANT
TUBE CONNECTING 12X1/4 (SUCTIONS) ×1 IMPLANT
VASCULAR TIE MAXI BLUE 18IN ST (MISCELLANEOUS)
YANKAUER SUCT BULB TIP NO VENT (SUCTIONS) ×1 IMPLANT

## 2022-03-16 NOTE — Transfer of Care (Signed)
Immediate Anesthesia Transfer of Care Note  Patient: Jordan Olson  Procedure(s) Performed: FISTULOTOMY (Rectum)  Patient Location: PACU  Anesthesia Type:MAC  Level of Consciousness: awake, alert , and oriented  Airway & Oxygen Therapy: Patient Spontanous Breathing  Post-op Assessment: Report given to RN and Post -op Vital signs reviewed and stable  Post vital signs: Reviewed and stable  Last Vitals:  Vitals Value Taken Time  BP 107/65 03/16/22 1106  Temp 36.4 C 03/16/22 1106  Pulse 79 03/16/22 1106  Resp 10 03/16/22 1106  SpO2 98 % 03/16/22 1106  Vitals shown include unvalidated device data.  Last Pain:  Vitals:   03/16/22 0717  TempSrc: Oral  PainSc: 0-No pain      Patients Stated Pain Goal: 3 (0000000 [id number])  Complications: No notable events documented.

## 2022-03-16 NOTE — Anesthesia Preprocedure Evaluation (Signed)
Anesthesia Evaluation  Patient identified by MRN, date of birth, ID band Patient awake    Reviewed: Allergy & Precautions, NPO status , Patient's Chart, lab work & pertinent test results  History of Anesthesia Complications Negative for: history of anesthetic complications  Airway Mallampati: I  TM Distance: >3 FB Neck ROM: Full    Dental no notable dental hx.    Pulmonary neg pulmonary ROS   Pulmonary exam normal        Cardiovascular negative cardio ROS Normal cardiovascular exam     Neuro/Psych  Headaches  negative psych ROS   GI/Hepatic Neg liver ROS,,,ANAL FISTULA   Endo/Other  negative endocrine ROS    Renal/GU negative Renal ROS  negative genitourinary   Musculoskeletal negative musculoskeletal ROS (+)    Abdominal   Peds  Hematology negative hematology ROS (+)   Anesthesia Other Findings Day of surgery medications reviewed with patient.  Reproductive/Obstetrics negative OB ROS                             Anesthesia Physical Anesthesia Plan  ASA: 2  Anesthesia Plan: MAC   Post-op Pain Management: Tylenol PO (pre-op)*   Induction:   PONV Risk Score and Plan: 2 and Treatment may vary due to age or medical condition, Propofol infusion, Midazolam and Ondansetron  Airway Management Planned: Natural Airway and Simple Face Mask  Additional Equipment: None  Intra-op Plan:   Post-operative Plan:   Informed Consent: I have reviewed the patients History and Physical, chart, labs and discussed the procedure including the risks, benefits and alternatives for the proposed anesthesia with the patient or authorized representative who has indicated his/her understanding and acceptance.       Plan Discussed with: CRNA  Anesthesia Plan Comments:        Anesthesia Quick Evaluation

## 2022-03-16 NOTE — Discharge Instructions (Addendum)
Beginning the day after surgery:  You may sit in a tub of warm water 2-3 times a day to relieve discomfort.  Eat a regular diet high in fiber.  Avoid foods that give you constipation or diarrhea.  Avoid foods that are difficult to digest, such as seeds, nuts, corn or popcorn.  Do not go any longer than 2 days without a bowel movement.  You may take a dose of Milk of Magnesia if you become constipated.    Drink 6-8 glasses of water daily.  Walking is encouraged.  Avoid strenuous activity and heavy lifting for one month after surgery.    Call the office if you have any questions or concerns.  Call immediately if you develop:  Excessive rectal bleeding (more than a cup or passing large clots) Increased discomfort Fever greater than 100 F Difficulty urinating   Post Anesthesia Home Care Instructions  Activity: Get plenty of rest for the remainder of the day. A responsible individual must stay with you for 24 hours following the procedure.  For the next 24 hours, DO NOT: -Drive a car -Paediatric nurse -Drink alcoholic beverages -Take any medication unless instructed by your physician -Make any legal decisions or sign important papers.  Meals: Start with liquid foods such as gelatin or soup. Progress to regular foods as tolerated. Avoid greasy, spicy, heavy foods. If nausea and/or vomiting occur, drink only clear liquids until the nausea and/or vomiting subsides. Call your physician if vomiting continues.  Special Instructions/Symptoms: Your throat may feel dry or sore from the anesthesia or the breathing tube placed in your throat during surgery. If this causes discomfort, gargle with warm salt water. The discomfort should disappear within 24 hours.  May take Tylenol beginning at 1 PM as needed for soreness/discomfort.

## 2022-03-16 NOTE — Interval H&P Note (Signed)
History and Physical Interval Note:  03/16/2022 7:39 AM  Jordan Olson  has presented today for surgery, with the diagnosis of ANAL FISTULA.  The various methods of treatment have been discussed with the patient and family. After consideration of risks, benefits and other options for treatment, the patient has consented to  Procedure(s): FISTULOTOMY (N/A) as a surgical intervention.  The patient's history has been reviewed, patient examined, no change in status, stable for surgery.  I have reviewed the patient's chart and labs.  Questions were answered to the patient's satisfaction.     Rosario Adie, MD  [facility]

## 2022-03-16 NOTE — Op Note (Signed)
03/16/2022  10:59 AM  PATIENT:  Jordan Olson  62 y.o. female  Patient Care Team: Laurey Morale, MD as PCP - General  PRE-OPERATIVE DIAGNOSIS:  ANAL FISTULA  POST-OPERATIVE DIAGNOSIS:  ANAL FISTULA  PROCEDURE:  FISTULOTOMY   Surgeon(s): Leighton Ruff, MD  ASSISTANT: none   ANESTHESIA:   local and MAC  SPECIMEN:  No Specimen  DISPOSITION OF SPECIMEN:  PATHOLOGY  COUNTS:  YES  PLAN OF CARE: Discharge to home after PACU  PATIENT DISPOSITION:  PACU - hemodynamically stable.  INDICATION: 62 y.o. 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     OR FINDINGS: simple fistula, involving small portion of superficial external sphincter complex only  DESCRIPTION: the patient was identified in the preoperative holding area and taken to the OR where they were laid on the operating room table.  MAC anesthesia was induced without difficulty. The patient was then positioned in lithotomy position with buttocks gently taped apart.  The patient was then prepped and draped in usual sterile fashion.  SCDs were noted to be in place prior to the initiation of anesthesia. A surgical timeout was performed indicating the correct patient, procedure, positioning and need for preoperative antibiotics.  A rectal block was performed using Marcaine with epinephrine.    I began with a digital rectal exam.  Sphincter tone was good.  I identified the fistula.  This was noted in the right anterior perianal region.  There was a small band of superficial external sphincter complex overriding the fistula tract.  A fistulotomy was performed using electrocautery.  The edges were then marsupialized using interrupted 2-0 chromic sutures.  Her perianal skin tags were left intact.  There was no involvement of the internal sphincter complex.  Hemostasis was good.  A dressing was applied.  The patient was then awakened from anesthesia and sent to the postanesthesia care unit in stable condition.  All counts  were correct per operating room staff.  Rosario Adie, MD  [facility]

## 2022-03-16 NOTE — Anesthesia Postprocedure Evaluation (Signed)
Anesthesia Post Note  Patient: Jordan Olson  Procedure(s) Performed: FISTULOTOMY (Rectum)     Patient location during evaluation: PACU Anesthesia Type: MAC Level of consciousness: awake and alert Pain management: pain level controlled Vital Signs Assessment: post-procedure vital signs reviewed and stable Respiratory status: spontaneous breathing, nonlabored ventilation and respiratory function stable Cardiovascular status: blood pressure returned to baseline Postop Assessment: no apparent nausea or vomiting Anesthetic complications: no   No notable events documented.  Last Vitals:  Vitals:   03/16/22 1130 03/16/22 1206  BP: 109/70 122/87  Pulse: 66 (!) 58  Resp: 18 17  Temp:    SpO2: 100% 100%    Last Pain:  Vitals:   03/16/22 1206  TempSrc:   PainSc: 0-No pain                 Marthenia Rolling

## 2022-03-19 ENCOUNTER — Encounter (HOSPITAL_BASED_OUTPATIENT_CLINIC_OR_DEPARTMENT_OTHER): Payer: Self-pay | Admitting: General Surgery

## 2022-05-25 ENCOUNTER — Other Ambulatory Visit: Payer: Self-pay | Admitting: Family Medicine

## 2022-08-10 ENCOUNTER — Other Ambulatory Visit: Payer: Self-pay | Admitting: Family Medicine

## 2022-08-29 ENCOUNTER — Encounter (INDEPENDENT_AMBULATORY_CARE_PROVIDER_SITE_OTHER): Payer: Self-pay

## 2022-09-24 ENCOUNTER — Encounter: Payer: Self-pay | Admitting: Family Medicine

## 2022-09-24 ENCOUNTER — Ambulatory Visit (INDEPENDENT_AMBULATORY_CARE_PROVIDER_SITE_OTHER): Payer: Commercial Managed Care - HMO | Admitting: Family Medicine

## 2022-09-24 VITALS — BP 108/74 | HR 83 | Temp 98.1°F | Ht 68.0 in | Wt 121.9 lb

## 2022-09-24 DIAGNOSIS — G8929 Other chronic pain: Secondary | ICD-10-CM

## 2022-09-24 DIAGNOSIS — M25561 Pain in right knee: Secondary | ICD-10-CM

## 2022-09-24 DIAGNOSIS — Z0000 Encounter for general adult medical examination without abnormal findings: Secondary | ICD-10-CM

## 2022-09-24 LAB — CBC WITH DIFFERENTIAL/PLATELET
Basophils Absolute: 0 10*3/uL (ref 0.0–0.1)
Basophils Relative: 0.9 % (ref 0.0–3.0)
Eosinophils Absolute: 0 10*3/uL (ref 0.0–0.7)
Eosinophils Relative: 0.6 % (ref 0.0–5.0)
HCT: 42.6 % (ref 36.0–46.0)
Hemoglobin: 13.7 g/dL (ref 12.0–15.0)
Lymphocytes Relative: 23.6 % (ref 12.0–46.0)
Lymphs Abs: 1 10*3/uL (ref 0.7–4.0)
MCHC: 32.1 g/dL (ref 30.0–36.0)
MCV: 91.5 fl (ref 78.0–100.0)
Monocytes Absolute: 0.4 10*3/uL (ref 0.1–1.0)
Monocytes Relative: 9 % (ref 3.0–12.0)
Neutro Abs: 2.9 10*3/uL (ref 1.4–7.7)
Neutrophils Relative %: 65.9 % (ref 43.0–77.0)
Platelets: 126 10*3/uL — ABNORMAL LOW (ref 150.0–400.0)
RBC: 4.65 Mil/uL (ref 3.87–5.11)
RDW: 13.6 % (ref 11.5–15.5)
WBC: 4.4 10*3/uL (ref 4.0–10.5)

## 2022-09-24 LAB — LIPID PANEL
Cholesterol: 247 mg/dL — ABNORMAL HIGH (ref 0–200)
HDL: 104.7 mg/dL (ref >39.00)
LDL Cholesterol: 130 mg/dL — ABNORMAL HIGH (ref 0–99)
NonHDL: 141.83
Total CHOL/HDL Ratio: 2
Triglycerides: 60 mg/dL (ref 0.0–149.0)
VLDL: 12 mg/dL (ref 0.0–40.0)

## 2022-09-24 LAB — BASIC METABOLIC PANEL
BUN: 20 mg/dL (ref 6–23)
CO2: 28 mEq/L (ref 19–32)
Calcium: 9.7 mg/dL (ref 8.4–10.5)
Chloride: 104 mEq/L (ref 96–112)
Creatinine, Ser: 0.92 mg/dL (ref 0.40–1.20)
GFR: 67.19 mL/min (ref >60.00)
Glucose, Bld: 102 mg/dL — ABNORMAL HIGH (ref 70–99)
Potassium: 4.4 mEq/L (ref 3.5–5.1)
Sodium: 141 mEq/L (ref 135–145)

## 2022-09-24 LAB — HEPATIC FUNCTION PANEL
ALT: 14 U/L (ref 0–35)
AST: 17 U/L (ref 0–37)
Albumin: 4.5 g/dL (ref 3.5–5.2)
Alkaline Phosphatase: 77 U/L (ref 39–117)
Bilirubin, Direct: 0.1 mg/dL (ref 0.0–0.3)
Total Bilirubin: 0.4 mg/dL (ref 0.2–1.2)
Total Protein: 6.8 g/dL (ref 6.0–8.3)

## 2022-09-24 LAB — TSH: TSH: 2.19 u[IU]/mL (ref 0.35–5.50)

## 2022-09-24 LAB — HEMOGLOBIN A1C: Hgb A1c MFr Bld: 5.6 % (ref 4.6–6.5)

## 2022-09-24 MED ORDER — BUTALBITAL-APAP-CAFF-COD 50-325-40-30 MG PO CAPS: 1.0000 | ORAL_CAPSULE | ORAL | 1 refills | Status: DC | PRN

## 2022-09-24 NOTE — Progress Notes (Signed)
Subjective:    Patient ID: Jordan Olson, female    DOB: 1960-12-17, 62 y.o.   MRN: [id number]  HPI Here for a well exam. Her only concern is pain in the right knee that started 5 months ago. No hx of trauma, but she has been a daily runner for years until this started. She has not run since then. The pain is just under the knee cap. No swelling. It mostly bothers her when she squats.   Review of Systems  Constitutional: Negative.   HENT: Negative.    Eyes: Negative.   Respiratory: Negative.    Cardiovascular: Negative.   Gastrointestinal: Negative.   Genitourinary:  Negative for decreased urine volume, difficulty urinating, dyspareunia, dysuria, enuresis, flank pain, frequency, hematuria, pelvic pain and urgency.  Musculoskeletal:  Positive for arthralgias.  Skin: Negative.   Neurological: Negative.  Negative for headaches.  Psychiatric/Behavioral: Negative.         Objective:   Physical Exam Constitutional:      General: She is not in acute distress.    Appearance: Normal appearance. She is well-developed.  HENT:     Head: Normocephalic and atraumatic.     Right Ear: External ear normal.     Left Ear: External ear normal.     Nose: Nose normal.     Mouth/Throat:     Pharynx: No oropharyngeal exudate.  Eyes:     General: No scleral icterus.    Conjunctiva/sclera: Conjunctivae normal.     Pupils: Pupils are equal, round, and reactive to light.  Neck:     Thyroid: No thyromegaly.     Vascular: No JVD.  Cardiovascular:     Rate and Rhythm: Normal rate and regular rhythm.     Pulses: Normal pulses.     Heart sounds: Normal heart sounds. No murmur heard.    No friction rub. No gallop.  Pulmonary:     Effort: Pulmonary effort is normal. No respiratory distress.     Breath sounds: Normal breath sounds. No wheezing or rales.  Chest:     Chest wall: No tenderness.  Abdominal:     General: Bowel sounds are normal. There is no distension.     Palpations: Abdomen is  soft. There is no mass.     Tenderness: There is no abdominal tenderness. There is no guarding or rebound.  Musculoskeletal:        General: Normal range of motion.     Cervical back: Normal range of motion and neck supple.     Comments: The right knee appears normal with no swelling. ROM is full. No crepitus. Pushing the patella laterally recreates the pain   Lymphadenopathy:     Cervical: No cervical adenopathy.  Skin:    General: Skin is warm and dry.     Findings: No erythema or rash.  Neurological:     General: No focal deficit present.     Mental Status: She is alert and oriented to person, place, and time.     Cranial Nerves: No cranial nerve deficit.     Motor: No abnormal muscle tone.     Coordination: Coordination normal.     Deep Tendon Reflexes: Reflexes are normal and symmetric. Reflexes normal.  Psychiatric:        Mood and Affect: Mood normal.        Behavior: Behavior normal.        Thought Content: Thought content normal.        Judgment: Judgment normal.  Assessment & Plan:  Well exam. We discussed diet and exercise. Get fasting labs. Her right knee pain is due to some lateral subluxation of the patella. We will refer her to PT for this.  Gershon Crane, MD

## 2022-09-26 ENCOUNTER — Telehealth: Payer: Self-pay | Admitting: Pharmacy Technician

## 2022-09-26 ENCOUNTER — Other Ambulatory Visit (HOSPITAL_COMMUNITY): Payer: Self-pay

## 2022-09-26 NOTE — Telephone Encounter (Signed)
Pharmacy Patient Advocate Encounter  Received notification from [facility] that Prior Authorization for Butalbital-APAP-Caff-Cod 50-325-40-30MG  capsules has been APPROVED from 09/26/2022 to 09/26/2023. Ran test claim, Copay is $16.67. This test claim was processed through [facility]- copay amounts may vary at other pharmacies due to pharmacy/plan contracts, or as the patient moves through the different stages of their insurance plan.   PA #/Case ID/Reference #: [id number] Key: [id number]

## 2022-10-03 ENCOUNTER — Ambulatory Visit: Payer: Commercial Managed Care - HMO | Admitting: Physical Therapy

## 2022-10-04 NOTE — Therapy (Signed)
OUTPATIENT PHYSICAL THERAPY LOWER EXTREMITY EVALUATION   Patient Name: Jordan Olson MRN: [id number] DOB:11-22-1960, 62 y.o., female Today's Date: 10/05/2022  END OF SESSION:  PT End of Session - 10/05/22 0854     Visit Number 1    Number of Visits 9    Date for PT Re-Evaluation 11/30/22    Authorization Type [employer] [place]    PT Start Time 0845    PT Stop Time 0925    PT Time Calculation (min) 40 min    Activity Tolerance Patient tolerated treatment well    Behavior During Therapy [facility] for tasks assessed/performed             Past Medical History:  Diagnosis Date   Anal abscess    Chronic constipation    Family history of adverse reaction to anesthesia    per pt her mother, mother's brother and sister have memory issues   Hemorrhoids    History of Lyme disease 2006   treated   (12-25-2021  per pt no residual's)   Migraines    Wears glasses    Past Surgical History:  Procedure Laterality Date   APPENDECTOMY  1990   BLEPHAROPLASTY W/ LASER Bilateral 11/2012   upper eyelid   BREAST BIOPSY Bilateral    Right 2011 Left 2014   COLONOSCOPY  08/2017   per Dr. Wandalee Ferdinand, internal hemorrhoids, no polyps, repeat in 10 yrs    FISTULOTOMY N/A 03/16/2022   Procedure: FISTULOTOMY;  Surgeon: Romie Levee, MD;  Location: [facility];  Service: General;  Laterality: N/A;   RECTAL EXAM UNDER ANESTHESIA N/A 12/28/2021   Procedure: ANAL EXAM UNDER ANESTHESIA, EXCISION OF ANAL CANAL MASS, DEBRIDEMENT OF ANAL CANAL ABSCESS;  Surgeon: Romie Levee, MD;  Location: [facility];  Service: General;  Laterality: N/A;  MAC   TONSILLECTOMY     age 58   Patient Active Problem List   Diagnosis Date Noted   Perirectal abscess 10/10/2021   Thrombocytopenia, unspecified (HCC) 11/10/2012   OTITIS MEDIA 01/11/2009   ACUTE SINUSITIS, UNSPECIFIED 01/11/2009   HEADACHE 01/11/2009    PCP: Nelwyn Salisbury, MD  REFERRING PROVIDER: Nelwyn Salisbury, MD  REFERRING DIAG:  [phone] (ICD-10-CM) - Chronic pain of right knee   THERAPY DIAG:  Chronic pain of right knee  Muscle weakness (generalized)  Rationale for Evaluation and Treatment: Rehabilitation  ONSET DATE: Chronic  SUBJECTIVE:   SUBJECTIVE STATEMENT: Pt presents to PT with reports of acute on chronic R knee pain with no MOI. Noted pain with a deep squat in April 2024. Is an avid runner and has been unable to do this without pain in last few months. Denies clicking or popping, also denies N/T.  PERTINENT HISTORY: None  PAIN:  Are you having pain?  No: NPRS scale: 0/10 Worst: 7/10 Pain location: R medial knee Pain description: sharp Aggravating factors: running, deep squat Relieving factors: rest  PRECAUTIONS: None  RED FLAGS: None   WEIGHT BEARING RESTRICTIONS: No  FALLS:  Has patient fallen in last 6 months? No  LIVING ENVIRONMENT: Lives with: lives with their family Lives in: House/apartment  OCCUPATION: Works part time as Personnel officer  PLOF: Independent  PATIENT GOALS: get back to jogging without knee pain  OBJECTIVE:   DIAGNOSTIC FINDINGS: N/A  PATIENT SURVEYS:  FOTO: 65% function; 76% predicted   COGNITION: Overall cognitive status: Within functional limits for tasks assessed     SENSATION: WFL  EDEMA:  N/A  MUSCLE LENGTH: Hamstrings:  Right WNL ; Left WNL  Thomas test: DNT  POSTURE: No Significant postural limitations  PALPATION: TTP to medial R knee joint line; R patellar hypomobility with lateral tracking  LOWER EXTREMITY ROM:  Active ROM Right eval Left eval  Hip flexion    Hip extension    Hip abduction    Hip adduction    Hip internal rotation    Hip external rotation    Knee flexion WNL   Knee extension WNL   Ankle dorsiflexion    Ankle plantarflexion    Ankle inversion    Ankle eversion     (Blank rows = not tested)  LOWER EXTREMITY MMT:  MMT Right eval Left eval  Hip flexion 5/5 5/5  Hip extension    Hip  abduction 4+/5 5/5  Hip adduction    Hip internal rotation    Hip external rotation    Knee flexion 5/5 5/5  Knee extension 4+/5 5/5  Ankle dorsiflexion    Ankle plantarflexion    Ankle inversion    Ankle eversion     (Blank rows = not tested)  LOWER EXTREMITY SPECIAL TESTS:  Knee special tests: Anterior drawer test: negative, Posterior drawer test: negative, Lachman Test: negative, and Patellafemoral grind test: positive   FUNCTIONAL TESTS:  30 Second Sit to Stand: 12 reps Functional squat (quad dominant): pain in medial R knee - decreased pain after McConnell taping Functional squat (ER): no pain  GAIT: Distance walked: 28ft Assistive device utilized: None Level of assistance: Complete Independence Comments: no overt deviations  TREATMENT: [facility] Adult PT Treatment:                                                DATE: 10/05/2022 Therapeutic Exercise: Supine QS x 5 - 5" hold Supine SLR x 15 R S/L hip abd x 15 R S/L clamshell x 10 GTB R Mcconell taping to R knee with lateral tracking   PATIENT EDUCATION:  Education details: eval findings, FOTO, HEP, POC Person educated: Patient Education method: Explanation, Demonstration, and Handouts Education comprehension: verbalized understanding and returned demonstration  HOME EXERCISE PROGRAM: Access Code: [email or web address] URL: [email or web address] Date: 10/05/2022 Prepared by: Edwinna Areola  Exercises - Supine Quadricep Sets  - 1 x daily - 5 x weekly - 2 sets - 10 reps - 5 sec hold - Active Straight Leg Raise with Quad Set  - 1 x daily - 5 x weekly - 3 sets - 15 reps - Sidelying Hip Abduction  - 1 x daily - 5 x weekly - 2 sets - 15 reps - Clamshell with Resistance  - 1 x daily - 5 x weekly - 3 sets - 10 reps - green band hold  ASSESSMENT:  CLINICAL IMPRESSION: Patient is a 62 y.o. F who was seen today for physical therapy evaluation and treatment for chronic R knee pain. Physical findings are consistent with  physician impression as she demonstrates decrease in LE strength and s/s consistent with PFPS. FOTO score shows subjective decrease in functional ability below PLOF. Pt would benefit from skilled PT working on LE strengthening in order to decrease knee pain.    OBJECTIVE IMPAIRMENTS: decreased activity tolerance, decreased mobility, decreased strength, and pain   ACTIVITY LIMITATIONS: squatting, stairs, and running  PARTICIPATION LIMITATIONS: community activity, yard work, and jogging  PERSONAL FACTORS: Time since onset of injury/illness/exacerbation are also  affecting patient's functional outcome.   REHAB POTENTIAL: Excellent  CLINICAL DECISION MAKING: Stable/uncomplicated  EVALUATION COMPLEXITY: Low   GOALS: Goals reviewed with patient? No  SHORT TERM GOALS: Target date: 10/26/2022   Pt will be compliant and knowledgeable with initial HEP for improved comfort and carryover Baseline: initial HEP given  Goal status: INITIAL  2.  Pt will self report right knee pain no greater than 4/10 for improved comfort and functional ability Baseline: 7/10 at worst Goal status: INITIAL   LONG TERM GOALS: Target date: 11/30/2022   Pt will improve FOTO function score to no less than 76% as proxy for functional improvement Baseline: 65% function Goal status: INITIAL   2.  Pt will self report right knee pain no greater than 1-2/10 for improved comfort and functional ability Baseline: 7/10 at worst Goal status: INITIAL   3.  Pt will increase R LE MMT to no less than 5/5 for all tested motions for improved stability and decreased R knee pain Baseline: see MMT chart Goal status: INITIAL  4.  Pt will be able to get back to jogging 3x/wk without increase in R knee pain in order to improve recreational activity tolerance Baseline: unable Goal status: INITIAL    PLAN:  PT FREQUENCY: 1x/week  PT DURATION: 8 weeks  PLANNED INTERVENTIONS: Therapeutic exercises, Therapeutic activity,  Neuromuscular re-education, Balance training, Gait training, Patient/Family education, Self Care, Joint mobilization, Dry Needling, Electrical stimulation, Cryotherapy, Moist heat, Manual therapy, and Re-evaluation  PLAN FOR NEXT SESSION: assess HEP response, R knee taping, quad/lateral hip strengthening   Eloy End, PT 10/05/2022, 9:33 AM

## 2022-10-05 ENCOUNTER — Ambulatory Visit: Payer: Commercial Managed Care - HMO | Attending: Family Medicine

## 2022-10-05 ENCOUNTER — Other Ambulatory Visit: Payer: Self-pay

## 2022-10-05 DIAGNOSIS — M25561 Pain in right knee: Secondary | ICD-10-CM | POA: Diagnosis present

## 2022-10-05 DIAGNOSIS — M6281 Muscle weakness (generalized): Secondary | ICD-10-CM | POA: Insufficient documentation

## 2022-10-05 DIAGNOSIS — G8929 Other chronic pain: Secondary | ICD-10-CM | POA: Diagnosis present

## 2022-10-18 ENCOUNTER — Ambulatory Visit: Payer: Commercial Managed Care - HMO | Admitting: Physical Therapy

## 2022-10-23 ENCOUNTER — Ambulatory Visit: Payer: Commercial Managed Care - HMO | Admitting: Physical Therapy

## 2022-10-30 ENCOUNTER — Ambulatory Visit: Payer: Commercial Managed Care - HMO

## 2022-11-02 ENCOUNTER — Other Ambulatory Visit (HOSPITAL_BASED_OUTPATIENT_CLINIC_OR_DEPARTMENT_OTHER): Payer: Self-pay | Admitting: Family Medicine

## 2022-11-02 DIAGNOSIS — Z1231 Encounter for screening mammogram for malignant neoplasm of breast: Secondary | ICD-10-CM

## 2022-11-06 ENCOUNTER — Ambulatory Visit: Payer: Commercial Managed Care - HMO | Attending: Family Medicine

## 2022-11-06 DIAGNOSIS — M6281 Muscle weakness (generalized): Secondary | ICD-10-CM | POA: Insufficient documentation

## 2022-11-06 DIAGNOSIS — G8929 Other chronic pain: Secondary | ICD-10-CM | POA: Insufficient documentation

## 2022-11-06 DIAGNOSIS — M25561 Pain in right knee: Secondary | ICD-10-CM | POA: Diagnosis present

## 2022-11-06 NOTE — Therapy (Signed)
OUTPATIENT PHYSICAL THERAPY TREATMENT   Patient Name: Jordan Olson MRN: [id number] DOB:06/08/1960, 62 y.o., female Today's Date: 11/06/2022  END OF SESSION:  PT End of Session - 11/06/22 0842     Visit Number 2    Number of Visits 9    Date for PT Re-Evaluation 11/30/22    Authorization Type [employer] [place]    PT Start Time 0845    PT Stop Time 0923    PT Time Calculation (min) 38 min    Activity Tolerance Patient tolerated treatment well    Behavior During Therapy [facility] for tasks assessed/performed              Past Medical History:  Diagnosis Date   Anal abscess    Chronic constipation    Family history of adverse reaction to anesthesia    per pt her mother, mother's brother and sister have memory issues   Hemorrhoids    History of Lyme disease 2006   treated   (12-25-2021  per pt no residual's)   Migraines    Wears glasses    Past Surgical History:  Procedure Laterality Date   APPENDECTOMY  1990   BLEPHAROPLASTY W/ LASER Bilateral 11/2012   upper eyelid   BREAST BIOPSY Bilateral    Right 2011 Left 2014   COLONOSCOPY  08/2017   per Dr. Wandalee Ferdinand, internal hemorrhoids, no polyps, repeat in 10 yrs    FISTULOTOMY N/A 03/16/2022   Procedure: FISTULOTOMY;  Surgeon: Romie Levee, MD;  Location: [facility];  Service: General;  Laterality: N/A;   RECTAL EXAM UNDER ANESTHESIA N/A 12/28/2021   Procedure: ANAL EXAM UNDER ANESTHESIA, EXCISION OF ANAL CANAL MASS, DEBRIDEMENT OF ANAL CANAL ABSCESS;  Surgeon: Romie Levee, MD;  Location: [facility] [place];  Service: General;  Laterality: N/A;  MAC   TONSILLECTOMY     age 62   Patient Active Problem List   Diagnosis Date Noted   Perirectal abscess 10/10/2021   Thrombocytopenia, unspecified (HCC) 11/10/2012   Otitis media 01/11/2009   ACUTE SINUSITIS, UNSPECIFIED 01/11/2009   Headache 01/11/2009    PCP: Nelwyn Salisbury, MD  REFERRING PROVIDER: Nelwyn Salisbury, MD  REFERRING DIAG:  [phone] (ICD-10-CM) - Chronic pain of right knee   THERAPY DIAG:  Chronic pain of right knee  Muscle weakness (generalized)  Rationale for Evaluation and Treatment: Rehabilitation  ONSET DATE: Chronic  SUBJECTIVE:   SUBJECTIVE STATEMENT: Pt presents to PT with reports of no current pain or discomfort. Has been compliant with HEP.   PERTINENT HISTORY: None  PAIN:  Are you having pain?  No: NPRS scale: 0/10 Worst: 7/10 Pain location: R medial knee Pain description: sharp Aggravating factors: running, deep squat Relieving factors: rest  PRECAUTIONS: None  RED FLAGS: None   WEIGHT BEARING RESTRICTIONS: No  FALLS:  Has patient fallen in last 6 months? No  LIVING ENVIRONMENT: Lives with: lives with their family Lives in: House/apartment  OCCUPATION: Works part time as Personnel officer  PLOF: Independent  PATIENT GOALS: get back to jogging without knee pain  OBJECTIVE:   DIAGNOSTIC FINDINGS: N/A  PATIENT SURVEYS:  FOTO: 65% function; 76% predicted  COGNITION: Overall cognitive status: Within functional limits for tasks assessed     SENSATION: WFL  EDEMA:  N/A  MUSCLE LENGTH: Hamstrings: Right WNL ; Left WNL  Thomas test: DNT  POSTURE: No Significant postural limitations  PALPATION: TTP to medial R knee joint line; R patellar hypomobility with lateral tracking  LOWER EXTREMITY  ROM:  Active ROM Right eval Left eval  Hip flexion    Hip extension    Hip abduction    Hip adduction    Hip internal rotation    Hip external rotation    Knee flexion WNL   Knee extension WNL   Ankle dorsiflexion    Ankle plantarflexion    Ankle inversion    Ankle eversion     (Blank rows = not tested)  LOWER EXTREMITY MMT:  MMT Right eval Left eval  Hip flexion 5/5 5/5  Hip extension    Hip abduction 4+/5 5/5  Hip adduction    Hip internal rotation    Hip external rotation    Knee flexion 5/5 5/5  Knee extension 4+/5 5/5  Ankle  dorsiflexion    Ankle plantarflexion    Ankle inversion    Ankle eversion     (Blank rows = not tested)  LOWER EXTREMITY SPECIAL TESTS:  Knee special tests: Anterior drawer test: negative, Posterior drawer test: negative, Lachman Test: negative, and Patellafemoral grind test: positive   FUNCTIONAL TESTS:  30 Second Sit to Stand: 12 reps Functional squat (quad dominant): pain in medial R knee - decreased pain after McConnell taping Functional squat (ER): no pain  GAIT: Distance walked: 15ft Assistive device utilized: None Level of assistance: Complete Independence Comments: no overt deviations  TREATMENT: [facility] Adult PT Treatment:                                                DATE: 11/06/2022 Therapeutic Exercise: Rec bike lvl 2.0 x 3 min while taking subjective Supine QS x 10 - 5" hold Supine SLR 2x10 2# Bridge 2x15 GTB S/L clamshell 2x15 GTB Eccentric heel tap 2x10 each 4in Lateral walk x 3 laps in // RTB Step up with march 2x10 8in Wall squat x 10  [facility] Adult PT Treatment:                                                DATE: 10/05/2022 Therapeutic Exercise: Supine QS x 5 - 5" hold Supine SLR x 15 R S/L hip abd x 15 R S/L clamshell x 10 GTB R Mcconell taping to R knee with lateral tracking   PATIENT EDUCATION:  Education details: eval findings, FOTO, HEP, POC Person educated: Patient Education method: Explanation, Demonstration, and Handouts Education comprehension: verbalized understanding and returned demonstration  HOME EXERCISE PROGRAM: Access Code: [email or web address] URL: [email or web address] Date: 11/06/2022 Prepared by: Edwinna Areola  Exercises - Supine Quadricep Sets  - 1 x daily - 5 x weekly - 2 sets - 10 reps - 5 sec hold - Active Straight Leg Raise with Quad Set  - 1 x daily - 5 x weekly - 3 sets - 15 reps - Sidelying Hip Abduction  - 1 x daily - 5 x weekly - 2 sets - 15 reps - Clamshell with Resistance  - 1 x daily - 5 x weekly - 3 sets - 10 reps  - green band hold - Supine Bridge with Resistance Band  - 1 x daily - 7 x weekly - 3 sets - 15 reps - green band hold - Forward Step Down Touch with Heel  - 1 x daily -  7 x weekly - 2 sets - 10 reps - Side Stepping with Resistance at Ankles and Counter Support  - 1 x daily - 7 x weekly - 2-3 reps - red band hold  ASSESSMENT:  CLINICAL IMPRESSION: Pt was able to complete all prescribed exercises with no adverse effect. Therapy today focused on improving quad and proximal hip strength for improving comfort and function of R knee. HEP updated for continued progression of strengthening exercises. Will continue to progress as able per POC.   EVAL: Patient is a 62 y.o. F who was seen today for physical therapy evaluation and treatment for chronic R knee pain. Physical findings are consistent with physician impression as she demonstrates decrease in LE strength and s/s consistent with PFPS. FOTO score shows subjective decrease in functional ability below PLOF. Pt would benefit from skilled PT working on LE strengthening in order to decrease knee pain.    OBJECTIVE IMPAIRMENTS: decreased activity tolerance, decreased mobility, decreased strength, and pain   ACTIVITY LIMITATIONS: squatting, stairs, and running  PARTICIPATION LIMITATIONS: community activity, yard work, and jogging  PERSONAL FACTORS: Time since onset of injury/illness/exacerbation are also affecting patient's functional outcome.   REHAB POTENTIAL: Excellent  CLINICAL DECISION MAKING: Stable/uncomplicated  EVALUATION COMPLEXITY: Low   GOALS: Goals reviewed with patient? No  SHORT TERM GOALS: Target date: 10/26/2022   Pt will be compliant and knowledgeable with initial HEP for improved comfort and carryover Baseline: initial HEP given  Goal status: INITIAL  2.  Pt will self report right knee pain no greater than 4/10 for improved comfort and functional ability Baseline: 7/10 at worst Goal status: INITIAL   LONG TERM GOALS:  Target date: 11/30/2022   Pt will improve FOTO function score to no less than 76% as proxy for functional improvement Baseline: 65% function Goal status: INITIAL   2.  Pt will self report right knee pain no greater than 1-2/10 for improved comfort and functional ability Baseline: 7/10 at worst Goal status: INITIAL   3.  Pt will increase R LE MMT to no less than 5/5 for all tested motions for improved stability and decreased R knee pain Baseline: see MMT chart Goal status: INITIAL  4.  Pt will be able to get back to jogging 3x/wk without increase in R knee pain in order to improve recreational activity tolerance Baseline: unable Goal status: INITIAL    PLAN:  PT FREQUENCY: 1x/week  PT DURATION: 8 weeks  PLANNED INTERVENTIONS: Therapeutic exercises, Therapeutic activity, Neuromuscular re-education, Balance training, Gait training, Patient/Family education, Self Care, Joint mobilization, Dry Needling, Electrical stimulation, Cryotherapy, Moist heat, Manual therapy, and Re-evaluation  PLAN FOR NEXT SESSION: assess HEP response, R knee taping, quad/lateral hip strengthening   Eloy End, PT 11/06/2022, 9:25 AM

## 2022-11-20 ENCOUNTER — Ambulatory Visit: Payer: Commercial Managed Care - HMO | Admitting: Physical Therapy

## 2022-11-20 ENCOUNTER — Encounter: Payer: Self-pay | Admitting: Physical Therapy

## 2022-11-20 DIAGNOSIS — M25561 Pain in right knee: Secondary | ICD-10-CM | POA: Diagnosis not present

## 2022-11-20 DIAGNOSIS — M6281 Muscle weakness (generalized): Secondary | ICD-10-CM

## 2022-11-20 DIAGNOSIS — G8929 Other chronic pain: Secondary | ICD-10-CM

## 2022-11-20 NOTE — Therapy (Signed)
OUTPATIENT PHYSICAL THERAPY TREATMENT   Patient Name: Jordan Olson MRN: [id number] DOB:July 14, 1960, 62 y.o., female Today's Date: 11/20/2022  END OF SESSION:  PT End of Session - 11/20/22 0759     Visit Number 3    Number of Visits 9    Date for PT Re-Evaluation 11/30/22    Authorization Type [employer] [place]    PT Start Time 0800    PT Stop Time 0845    PT Time Calculation (min) 45 min              Past Medical History:  Diagnosis Date   Anal abscess    Chronic constipation    Family history of adverse reaction to anesthesia    per pt her mother, mother's brother and sister have memory issues   Hemorrhoids    History of Lyme disease 2006   treated   (12-25-2021  per pt no residual's)   Migraines    Wears glasses    Past Surgical History:  Procedure Laterality Date   APPENDECTOMY  1990   BLEPHAROPLASTY W/ LASER Bilateral 11/2012   upper eyelid   BREAST BIOPSY Bilateral    Right 2011 Left 2014   COLONOSCOPY  08/2017   per Dr. Wandalee Ferdinand, internal hemorrhoids, no polyps, repeat in 10 yrs    FISTULOTOMY N/A 03/16/2022   Procedure: FISTULOTOMY;  Surgeon: Romie Levee, MD;  Location: [facility] [place];  Service: General;  Laterality: N/A;   RECTAL EXAM UNDER ANESTHESIA N/A 12/28/2021   Procedure: ANAL EXAM UNDER ANESTHESIA, EXCISION OF ANAL CANAL MASS, DEBRIDEMENT OF ANAL CANAL ABSCESS;  Surgeon: Romie Levee, MD;  Location: [facility];  Service: General;  Laterality: N/A;  MAC   TONSILLECTOMY     age 40   Patient Active Problem List   Diagnosis Date Noted   Perirectal abscess 10/10/2021   Thrombocytopenia, unspecified (HCC) 11/10/2012   Otitis media 01/11/2009   ACUTE SINUSITIS, UNSPECIFIED 01/11/2009   Headache 01/11/2009    PCP: Nelwyn Salisbury, MD  REFERRING PROVIDER: Nelwyn Salisbury, MD  REFERRING DIAG: [phone] (ICD-10-CM) - Chronic pain of right knee   THERAPY DIAG:  Chronic pain of right knee  Muscle weakness  (generalized)  Rationale for Evaluation and Treatment: Rehabilitation  ONSET DATE: Chronic  SUBJECTIVE:   SUBJECTIVE STATEMENT: Pt presents to PT with reports of no current pain or discomfort. Has been compliant with HEP, has resumed jogging without pain.   PERTINENT HISTORY: None  PAIN:  Are you having pain?  No: NPRS scale: 0/10 Worst: 7/10 Pain location: R medial knee Pain description: sharp Aggravating factors: running, deep squat Relieving factors: rest  PRECAUTIONS: None  RED FLAGS: None   WEIGHT BEARING RESTRICTIONS: No  FALLS:  Has patient fallen in last 6 months? No  LIVING ENVIRONMENT: Lives with: lives with their family Lives in: House/apartment  OCCUPATION: Works part time as Personnel officer  PLOF: Independent  PATIENT GOALS: get back to jogging without knee pain  OBJECTIVE:   DIAGNOSTIC FINDINGS: N/A  PATIENT SURVEYS:  FOTO: 65% function; 76% predicted  COGNITION: Overall cognitive status: Within functional limits for tasks assessed     SENSATION: WFL  EDEMA:  N/A  MUSCLE LENGTH: Hamstrings: Right WNL ; Left WNL  Thomas test: DNT  POSTURE: No Significant postural limitations  PALPATION: TTP to medial R knee joint line; R patellar hypomobility with lateral tracking  LOWER EXTREMITY ROM:  Active ROM Right eval Left eval  Hip flexion  Hip extension    Hip abduction    Hip adduction    Hip internal rotation    Hip external rotation    Knee flexion WNL   Knee extension WNL   Ankle dorsiflexion    Ankle plantarflexion    Ankle inversion    Ankle eversion     (Blank rows = not tested)  LOWER EXTREMITY MMT:  MMT Right eval Left eval  Hip flexion 5/5 5/5  Hip extension    Hip abduction 4+/5 5/5  Hip adduction    Hip internal rotation    Hip external rotation    Knee flexion 5/5 5/5  Knee extension 4+/5 5/5  Ankle dorsiflexion    Ankle plantarflexion    Ankle inversion    Ankle eversion     (Blank rows =  not tested)  LOWER EXTREMITY SPECIAL TESTS:  Knee special tests: Anterior drawer test: negative, Posterior drawer test: negative, Lachman Test: negative, and Patellafemoral grind test: positive   FUNCTIONAL TESTS:  30 Second Sit to Stand: 12 reps Functional squat (quad dominant): pain in medial R knee - decreased pain after McConnell taping Functional squat (ER): no pain  GAIT: Distance walked: 56ft Assistive device utilized: None Level of assistance: Complete Independence Comments: no overt deviations  TREATMENT: [facility] Adult PT Treatment:                                                DATE: 11/20/22 Therapeutic Exercise: Elliptical L3 R3 x 3 minutes for warm up  Wall slide 10 x 2     Step up with march 2x10 8in    Eccentric heel tap 2x10 each 4in Lateral Walk GTB at ankles , at counter x 6 lengths  Standing on 4 inch step with opp hip abduction/extension 10 x 2 each  STS with GTB at knees  x 10 Squat tap to table with GTB at knees Bridge with GTB 10 x 2  Figure 4 stretch bilateral  Piriformis stretch SLR 2# 10 x 2 , one set with ER  QS 5 sec x 10 each Side hip circles x 10 each CW /CCW     [facility] Adult PT Treatment:                                                DATE: 11/06/2022 Therapeutic Exercise: Rec bike lvl 2.0 x 3 min while taking subjective Supine QS x 10 - 5" hold Supine SLR 2x10 2# Bridge 2x15 GTB S/L clamshell 2x15 GTB Eccentric heel tap 2x10 each 4in Lateral walk x 3 laps in // RTB Step up with march 2x10 8in Wall squat x 10  [facility] Adult PT Treatment:                                                DATE: 10/05/2022 Therapeutic Exercise: Supine QS x 5 - 5" hold Supine SLR x 15 R S/L hip abd x 15 R S/L clamshell x 10 GTB R Mcconell taping to R knee with lateral tracking   PATIENT EDUCATION:  Education details: eval findings, FOTO, HEP,  POC Person educated: Patient Education method: Explanation, Demonstration, and Handouts Education comprehension:  verbalized understanding and returned demonstration  HOME EXERCISE PROGRAM: Access Code: [email or web address] URL: [email or web address] Date: 11/06/2022 Prepared by: Edwinna Areola  Exercises - Supine Quadricep Sets  - 1 x daily - 5 x weekly - 2 sets - 10 reps - 5 sec hold - Active Straight Leg Raise with Quad Set  - 1 x daily - 5 x weekly - 3 sets - 15 reps - Sidelying Hip Abduction  - 1 x daily - 5 x weekly - 2 sets - 15 reps - Clamshell with Resistance  - 1 x daily - 5 x weekly - 3 sets - 10 reps - green band hold - Supine Bridge with Resistance Band  - 1 x daily - 7 x weekly - 3 sets - 15 reps - green band hold - Forward Step Down Touch with Heel  - 1 x daily - 7 x weekly - 2 sets - 10 reps - Side Stepping with Resistance at Ankles and Counter Support  - 1 x daily - 7 x weekly - 2-3 reps - red band hold - Standing Hip Abduction with Counter Support  - 1 x daily - 7 x weekly - 1-2 sets - 10 reps ASSESSMENT:  CLINICAL IMPRESSION: Have been running 2 miles without pain. Not as fast or as long as usual. I still have some trouble with deep squats. Overall pt reports significant improvement, noting pain at worst is 3/10. She is independent with HEP. STG #1, #2 met. Pt was able to complete all prescribed exercises with no adverse effect. Therapy today focused on improving quad and proximal hip strength for improving comfort and function of R knee. HEP updated for continued progression of strengthening exercises. Will continue to progress as able per POC.   EVAL: Patient is a 62 y.o. F who was seen today for physical therapy evaluation and treatment for chronic R knee pain. Physical findings are consistent with physician impression as she demonstrates decrease in LE strength and s/s consistent with PFPS. FOTO score shows subjective decrease in functional ability below PLOF. Pt would benefit from skilled PT working on LE strengthening in order to decrease knee pain.    OBJECTIVE IMPAIRMENTS:  decreased activity tolerance, decreased mobility, decreased strength, and pain   ACTIVITY LIMITATIONS: squatting, stairs, and running  PARTICIPATION LIMITATIONS: community activity, yard work, and jogging  PERSONAL FACTORS: Time since onset of injury/illness/exacerbation are also affecting patient's functional outcome.   REHAB POTENTIAL: Excellent  CLINICAL DECISION MAKING: Stable/uncomplicated  EVALUATION COMPLEXITY: Low   GOALS: Goals reviewed with patient? No  SHORT TERM GOALS: Target date: 10/26/2022   Pt will be compliant and knowledgeable with initial HEP for improved comfort and carryover Baseline: initial HEP given  Goal status: MET  2.  Pt will self report right knee pain no greater than 4/10 for improved comfort and functional ability Baseline: 7/10 at worst 11/20/22: 3/10 at worst  Goal status: MET   LONG TERM GOALS: Target date: 11/30/2022   Pt will improve FOTO function score to no less than 76% as proxy for functional improvement Baseline: 65% function Goal status: INITIAL   2.  Pt will self report right knee pain no greater than 1-2/10 for improved comfort and functional ability Baseline: 7/10 at worst Goal status: INITIAL   3.  Pt will increase R LE MMT to no less than 5/5 for all tested motions for improved stability and decreased R knee pain Baseline: see  MMT chart Goal status: INITIAL  4.  Pt will be able to get back to jogging 3x/wk without increase in R knee pain in order to improve recreational activity tolerance Baseline: unable Goal status: INITIAL    PLAN:  PT FREQUENCY: 1x/week  PT DURATION: 8 weeks  PLANNED INTERVENTIONS: Therapeutic exercises, Therapeutic activity, Neuromuscular re-education, Balance training, Gait training, Patient/Family education, Self Care, Joint mobilization, Dry Needling, Electrical stimulation, Cryotherapy, Moist heat, Manual therapy, and Re-evaluation  PLAN FOR NEXT SESSION: assess HEP response, R knee taping,  quad/lateral hip strengthening   Sherrie Mustache, PTA 11/20/2022, 8:44 AM

## 2022-11-22 ENCOUNTER — Ambulatory Visit (HOSPITAL_BASED_OUTPATIENT_CLINIC_OR_DEPARTMENT_OTHER)
Admission: RE | Admit: 2022-11-22 | Discharge: 2022-11-22 | Disposition: A | Discharge status: Home / Self Care | Payer: Commercial Managed Care - HMO | Source: Ambulatory Visit | Attending: Family Medicine | Admitting: Family Medicine

## 2022-11-22 DIAGNOSIS — Z1231 Encounter for screening mammogram for malignant neoplasm of breast: Secondary | ICD-10-CM | POA: Diagnosis present

## 2022-11-26 ENCOUNTER — Other Ambulatory Visit: Payer: Self-pay | Admitting: Family Medicine

## 2022-11-27 ENCOUNTER — Encounter: Payer: Self-pay | Admitting: Physical Therapy

## 2022-11-27 ENCOUNTER — Ambulatory Visit: Payer: Commercial Managed Care - HMO | Admitting: Physical Therapy

## 2022-11-27 DIAGNOSIS — M6281 Muscle weakness (generalized): Secondary | ICD-10-CM

## 2022-11-27 DIAGNOSIS — G8929 Other chronic pain: Secondary | ICD-10-CM

## 2022-11-27 DIAGNOSIS — M25561 Pain in right knee: Secondary | ICD-10-CM | POA: Diagnosis not present

## 2022-11-27 NOTE — Therapy (Signed)
OUTPATIENT PHYSICAL THERAPY TREATMENT   Patient Name: Jordan Olson MRN: [id number] DOB:10/07/1960, 62 y.o., female Today's Date: 11/27/2022  END OF SESSION:  PT End of Session - 11/27/22 0755     Visit Number 4    Number of Visits 9    Date for PT Re-Evaluation 11/30/22    Authorization Type [employer]    PT Start Time 0800    PT Stop Time 0843    PT Time Calculation (min) 43 min              Past Medical History:  Diagnosis Date   Anal abscess    Chronic constipation    Family history of adverse reaction to anesthesia    per pt her mother, mother's brother and sister have memory issues   Hemorrhoids    History of Lyme disease 2006   treated   (12-25-2021  per pt no residual's)   Migraines    Wears glasses    Past Surgical History:  Procedure Laterality Date   APPENDECTOMY  1990   BLEPHAROPLASTY W/ LASER Bilateral 11/2012   upper eyelid   BREAST BIOPSY Bilateral    Right 2011 Left 2014   COLONOSCOPY  08/2017   per Dr. Wandalee Ferdinand, internal hemorrhoids, no polyps, repeat in 10 yrs    FISTULOTOMY N/A 03/16/2022   Procedure: FISTULOTOMY;  Surgeon: Romie Levee, MD;  Location: [facility];  Service: General;  Laterality: N/A;   RECTAL EXAM UNDER ANESTHESIA N/A 12/28/2021   Procedure: ANAL EXAM UNDER ANESTHESIA, EXCISION OF ANAL CANAL MASS, DEBRIDEMENT OF ANAL CANAL ABSCESS;  Surgeon: Romie Levee, MD;  Location: [facility];  Service: General;  Laterality: N/A;  MAC   TONSILLECTOMY     age 44   Patient Active Problem List   Diagnosis Date Noted   Perirectal abscess 10/10/2021   Thrombocytopenia, unspecified (HCC) 11/10/2012   Otitis media 01/11/2009   ACUTE SINUSITIS, UNSPECIFIED 01/11/2009   Headache 01/11/2009    PCP: Nelwyn Salisbury, MD  REFERRING PROVIDER: Nelwyn Salisbury, MD  REFERRING DIAG: [phone] (ICD-10-CM) - Chronic pain of right knee   THERAPY DIAG:  Chronic pain of right knee  Muscle weakness  (generalized)  Rationale for Evaluation and Treatment: Rehabilitation  ONSET DATE: Chronic  SUBJECTIVE:   SUBJECTIVE STATEMENT: Pt presents to PT with reports of no current pain or discomfort. Has been compliant with HEP, has resumed jogging without pain.   PERTINENT HISTORY: None  PAIN:  Are you having pain?  No: NPRS scale: 0/10 Worst: 7/10 Pain location: R medial knee Pain description: sharp Aggravating factors: running, deep squat Relieving factors: rest  PRECAUTIONS: None  RED FLAGS: None   WEIGHT BEARING RESTRICTIONS: No  FALLS:  Has patient fallen in last 6 months? No  LIVING ENVIRONMENT: Lives with: lives with their family Lives in: House/apartment  OCCUPATION: Works part time as Personnel officer  PLOF: Independent  PATIENT GOALS: get back to jogging without knee pain  OBJECTIVE:   DIAGNOSTIC FINDINGS: N/A  PATIENT SURVEYS:  FOTO: 65% function; 76% predicted  COGNITION: Overall cognitive status: Within functional limits for tasks assessed     SENSATION: WFL  EDEMA:  N/A  MUSCLE LENGTH: Hamstrings: Right WNL ; Left WNL  Thomas test: DNT  POSTURE: No Significant postural limitations  PALPATION: TTP to medial R knee joint line; R patellar hypomobility with lateral tracking  LOWER EXTREMITY ROM:  Active ROM Right eval Left eval  Hip flexion  Hip extension    Hip abduction    Hip adduction    Hip internal rotation    Hip external rotation    Knee flexion WNL   Knee extension WNL   Ankle dorsiflexion    Ankle plantarflexion    Ankle inversion    Ankle eversion     (Blank rows = not tested)  LOWER EXTREMITY MMT:  MMT Right eval Left eval  Hip flexion 5/5 5/5  Hip extension    Hip abduction 4+/5 5/5  Hip adduction    Hip internal rotation    Hip external rotation    Knee flexion 5/5 5/5  Knee extension 4+/5 5/5  Ankle dorsiflexion    Ankle plantarflexion    Ankle inversion    Ankle eversion     (Blank rows =  not tested)  LOWER EXTREMITY SPECIAL TESTS:  Knee special tests: Anterior drawer test: negative, Posterior drawer test: negative, Lachman Test: negative, and Patellafemoral grind test: positive   FUNCTIONAL TESTS:  30 Second Sit to Stand: 12 reps Functional squat (quad dominant): pain in medial R knee - decreased pain after McConnell taping Functional squat (ER): no pain  GAIT: Distance walked: 30ft Assistive device utilized: None Level of assistance: Complete Independence Comments: no overt deviations  TREATMENT: [facility] Adult PT Treatment:                                                DATE: 11/27/22 Therapeutic Exercise: Standing on AIREX with opp hip abduction/extension 10 x 2 each  Wall slide 10 x 2  Eccentric heel tap 2x10 each 4in Rebounder toss SLS floor x15, Foam oval x 15  Wall sit 10 sec x10 Bosu dome Step up - light touch   R SLS with ABCs  Lateral squats with Green band at thighs 15 feet x 4  Squat taps to bariatric chair Single leg STS- light touch using left foot Bilat Cybex legpress 60# Bilat cybex leg press 20# RLE    [facility] Adult PT Treatment:                                                DATE: 11/20/22 Therapeutic Exercise: Elliptical L3 R3 x 3 minutes for warm up  Wall slide 10 x 2     Step up with march 2x10 8in    Eccentric heel tap 2x10 each 4in Lateral Walk GTB at ankles , at counter x 6 lengths  Standing on 4 inch step with opp hip abduction/extension 10 x 2 each  STS with GTB at knees  x 10 Squat tap to table with GTB at knees Bridge with GTB 10 x 2  Figure 4 stretch bilateral  Piriformis stretch SLR 2# 10 x 2 , one set with ER  QS 5 sec x 10 each Side hip circles x 10 each CW /CCW     [facility] Adult PT Treatment:                                                DATE: 11/06/2022 Therapeutic Exercise: Rec bike lvl 2.0 x  3 min while taking subjective Supine QS x 10 - 5" hold Supine SLR 2x10 2# Bridge 2x15 GTB S/L clamshell 2x15 GTB Eccentric  heel tap 2x10 each 4in Lateral walk x 3 laps in // RTB Step up with march 2x10 8in Wall squat x 10  [facility] Adult PT Treatment:                                                DATE: 10/05/2022 Therapeutic Exercise: Supine QS x 5 - 5" hold Supine SLR x 15 R S/L hip abd x 15 R S/L clamshell x 10 GTB R Mcconell taping to R knee with lateral tracking   PATIENT EDUCATION:  Education details: eval findings, FOTO, HEP, POC Person educated: Patient Education method: Explanation, Demonstration, and Handouts Education comprehension: verbalized understanding and returned demonstration  HOME EXERCISE PROGRAM: Access Code: [email or web address] URL: [email or web address] Date: 11/06/2022 Prepared by: Edwinna Areola  Exercises - Supine Quadricep Sets  - 1 x daily - 5 x weekly - 2 sets - 10 reps - 5 sec hold - Active Straight Leg Raise with Quad Set  - 1 x daily - 5 x weekly - 3 sets - 15 reps - Sidelying Hip Abduction  - 1 x daily - 5 x weekly - 2 sets - 15 reps - Clamshell with Resistance  - 1 x daily - 5 x weekly - 3 sets - 10 reps - green band hold - Supine Bridge with Resistance Band  - 1 x daily - 7 x weekly - 3 sets - 15 reps - green band hold - Forward Step Down Touch with Heel  - 1 x daily - 7 x weekly - 2 sets - 10 reps - Side Stepping with Resistance at Ankles and Counter Support  - 1 x daily - 7 x weekly - 2-3 reps - red band hold - Standing Hip Abduction with Counter Support  - 1 x daily - 7 x weekly - 1-2 sets - 10 reps - Single Leg Sit to Stand with Arms Extended  - 1 x daily - 7 x weekly - 1-2 sets - 10 reps ASSESSMENT:  CLINICAL IMPRESSION: Playing with Grandchildren for 3 days included a lot of stooping a lot and getting up and down. Pt reports this has made the anterior knee sore. Still just the deep squat that bothers the knee, pain can be 4-5/10 with deep squat. Otherwise, mostly pain free. Back to jogging wihtout pain, wear compression sleeve. Pt was able to complete all  prescribed exercises with no adverse effect. Therapy today focused on improving quad and proximal hip strength for improving comfort and function of R knee. HEP updated for continued progression of strengthening exercises. Will continue to progress as able per POC.   EVAL: Patient is a 62 y.o. F who was seen today for physical therapy evaluation and treatment for chronic R knee pain. Physical findings are consistent with physician impression as she demonstrates decrease in LE strength and s/s consistent with PFPS. FOTO score shows subjective decrease in functional ability below PLOF. Pt would benefit from skilled PT working on LE strengthening in order to decrease knee pain.    OBJECTIVE IMPAIRMENTS: decreased activity tolerance, decreased mobility, decreased strength, and pain   ACTIVITY LIMITATIONS: squatting, stairs, and running  PARTICIPATION LIMITATIONS: community activity, yard work, and jogging  PERSONAL FACTORS: Time since onset  of injury/illness/exacerbation are also affecting patient's functional outcome.   REHAB POTENTIAL: Excellent  CLINICAL DECISION MAKING: Stable/uncomplicated  EVALUATION COMPLEXITY: Low   GOALS: Goals reviewed with patient? No  SHORT TERM GOALS: Target date: 10/26/2022   Pt will be compliant and knowledgeable with initial HEP for improved comfort and carryover Baseline: initial HEP given  Goal status: MET  2.  Pt will self report right knee pain no greater than 4/10 for improved comfort and functional ability Baseline: 7/10 at worst 11/20/22: 3/10 at worst  Goal status: MET   LONG TERM GOALS: Target date: 11/30/2022   Pt will improve FOTO function score to no less than 76% as proxy for functional improvement Baseline: 65% function Goal status: INITIAL   2.  Pt will self report right knee pain no greater than 1-2/10 for improved comfort and functional ability Baseline: 7/10 at worst Goal status: INITIAL   3.  Pt will increase R LE MMT to no less  than 5/5 for all tested motions for improved stability and decreased R knee pain Baseline: see MMT chart Goal status: INITIAL  4.  Pt will be able to get back to jogging 3x/wk without increase in R knee pain in order to improve recreational activity tolerance Baseline: unable Goal status: INITIAL    PLAN:  PT FREQUENCY: 1x/week  PT DURATION: 8 weeks  PLANNED INTERVENTIONS: Therapeutic exercises, Therapeutic activity, Neuromuscular re-education, Balance training, Gait training, Patient/Family education, Self Care, Joint mobilization, Dry Needling, Electrical stimulation, Cryotherapy, Moist heat, Manual therapy, and Re-evaluation  PLAN FOR NEXT SESSION:Discharge next visit.    Jannette Spanner, PTA 11/27/22 9:08 AM Phone: [phone] Fax: [phone]

## 2022-12-04 ENCOUNTER — Ambulatory Visit: Payer: Commercial Managed Care - HMO | Attending: Family Medicine

## 2022-12-04 DIAGNOSIS — M25561 Pain in right knee: Secondary | ICD-10-CM | POA: Insufficient documentation

## 2022-12-04 DIAGNOSIS — M6281 Muscle weakness (generalized): Secondary | ICD-10-CM | POA: Diagnosis present

## 2022-12-04 DIAGNOSIS — G8929 Other chronic pain: Secondary | ICD-10-CM | POA: Insufficient documentation

## 2022-12-04 NOTE — Therapy (Signed)
OUTPATIENT PHYSICAL THERAPY TREATMENT/DISCHARGE  PHYSICAL THERAPY DISCHARGE SUMMARY  Visits from Start of Care: 5  Current functional level related to goals / functional outcomes: See goals and objective   Remaining deficits: See goals and objective   Education / Equipment: HEP   Patient agrees to discharge. Patient goals were met. Patient is being discharged due to meeting the stated rehab goals.   Patient Name: Jordan Olson MRN: [id number] DOB:[birth year], 61 y.o., female Today's Date: 12/04/2022  END OF SESSION:  PT End of Session - 12/04/22 0843     Visit Number 5    Number of Visits 9    Date for PT Re-Evaluation 11/30/22    Authorization Type [employer] [place]    PT Start Time 0845    PT Stop Time 0924    PT Time Calculation (min) 39 min               Past Medical History:  Diagnosis Date   Anal abscess    Chronic constipation    Family history of adverse reaction to anesthesia    per pt her mother, mother's brother and sister have memory issues   Hemorrhoids    History of Lyme disease 2006   treated   (12-25-2021  per pt no residual's)   Migraines    Wears glasses    Past Surgical History:  Procedure Laterality Date   APPENDECTOMY  1990   BLEPHAROPLASTY W/ LASER Bilateral 11/2012   upper eyelid   BREAST BIOPSY Bilateral    Right 2011 Left 2014   COLONOSCOPY  08/2017   per Dr. Wandalee Ferdinand, internal hemorrhoids, no polyps, repeat in 10 yrs    FISTULOTOMY N/A 03/16/2022   Procedure: FISTULOTOMY;  Surgeon: Romie Levee, MD;  Location: [facility] [place];  Service: General;  Laterality: N/A;   RECTAL EXAM UNDER ANESTHESIA N/A 12/28/2021   Procedure: ANAL EXAM UNDER ANESTHESIA, EXCISION OF ANAL CANAL MASS, DEBRIDEMENT OF ANAL CANAL ABSCESS;  Surgeon: Romie Levee, MD;  Location: [facility] [place];  Service: General;  Laterality: N/A;  MAC   TONSILLECTOMY     age 62   Patient Active Problem List   Diagnosis Date Noted   Perirectal  abscess 10/10/2021   Thrombocytopenia, unspecified (HCC) 11/10/2012   Otitis media 01/11/2009   ACUTE SINUSITIS, UNSPECIFIED 01/11/2009   Headache 01/11/2009    PCP: Nelwyn Salisbury, MD  REFERRING PROVIDER: Nelwyn Salisbury, MD  REFERRING DIAG: [phone] (ICD-10-CM) - Chronic pain of right knee   THERAPY DIAG:  Chronic pain of right knee  Muscle weakness (generalized)  Rationale for Evaluation and Treatment: Rehabilitation  ONSET DATE: Chronic  SUBJECTIVE:   SUBJECTIVE STATEMENT: Pt presents to PT with no current reports of pain. Went on a 4 mile jog last weekend and had a little R knee pain. Also has continued disocmfort with deep squatting, but otherwise is doing much better and remains compliant with HEP.   PERTINENT HISTORY: None  PAIN:  Are you having pain?  No: NPRS scale: 0/10 Worst: 7/10 Pain location: R medial knee Pain description: sharp Aggravating factors: running, deep squat Relieving factors: rest  PRECAUTIONS: None  RED FLAGS: None   WEIGHT BEARING RESTRICTIONS: No  FALLS:  Has patient fallen in last 6 months? No  LIVING ENVIRONMENT: Lives with: lives with their family Lives in: House/apartment  OCCUPATION: Works part time as Personnel officer  PLOF: Independent  PATIENT GOALS: get back to jogging without knee pain  OBJECTIVE:   DIAGNOSTIC  FINDINGS: N/A  PATIENT SURVEYS:  FOTO: 65% function; 76% predicted 12/04/2022: 84% function  COGNITION: Overall cognitive status: Within functional limits for tasks assessed     SENSATION: WFL  EDEMA:  N/A  MUSCLE LENGTH: Hamstrings: Right WNL ; Left WNL  Thomas test: DNT  POSTURE: No Significant postural limitations  PALPATION: TTP to medial R knee joint line; R patellar hypomobility with lateral tracking  LOWER EXTREMITY ROM:  Active ROM Right eval Left eval  Hip flexion    Hip extension    Hip abduction    Hip adduction    Hip internal rotation    Hip external  rotation    Knee flexion WNL   Knee extension WNL   Ankle dorsiflexion    Ankle plantarflexion    Ankle inversion    Ankle eversion     (Blank rows = not tested)  LOWER EXTREMITY MMT:  MMT Right eval Left eval Right 12/04/22  Hip flexion 5/5 5/5   Hip extension     Hip abduction 4+/5 5/5 5/5  Hip adduction     Hip internal rotation     Hip external rotation     Knee flexion 5/5 5/5   Knee extension 4+/5 5/5 5/5  Ankle dorsiflexion     Ankle plantarflexion     Ankle inversion     Ankle eversion      (Blank rows = not tested)  LOWER EXTREMITY SPECIAL TESTS:  Knee special tests: Anterior drawer test: negative, Posterior drawer test: negative, Lachman Test: negative, and Patellafemoral grind test: positive   FUNCTIONAL TESTS:  30 Second Sit to Stand: 12 reps Functional squat (quad dominant): pain in medial R knee - decreased pain after McConnell taping Functional squat (ER): no pain  GAIT: Distance walked: 43ft Assistive device utilized: None Level of assistance: Complete Independence Comments: no overt deviations  TREATMENT: [facility] Adult PT Treatment:                                                DATE: 12/04/22 Therapeutic Exercise:  Supine QS x 10 - 5" hold  Supine SLR x 15 each 2.5#  S/L hip abd x 15 2.5#  Bridge with blue band x 10  S/L clamshell x 10 blue band  STS R back x 10  Lateral walk RTB x 3 laps   Standing hip abd x 15 each RTB  Eccentric heel tap x 10 R  Wall squat x 10 Therapeutic Activity:  Assessment of tests/measures, goals, and outcomes for discharge  [facility] Adult PT Treatment:                                                DATE: 11/27/22 Therapeutic Exercise: Standing on AIREX with opp hip abduction/extension 10 x 2 each  Wall slide 10 x 2  Eccentric heel tap 2x10 each 4in Rebounder toss SLS floor x15, Foam oval x 15  Wall sit 10 sec x10 Bosu dome Step up - light touch   R SLS with ABCs  Lateral squats with Green band at thighs 15 feet x  4  Squat taps to bariatric chair Single leg STS- light touch using left foot Bilat Cybex legpress 60# Bilat cybex leg press 20#  RLE    [facility] Adult PT Treatment:                                                DATE: 11/20/22 Therapeutic Exercise: Elliptical L3 R3 x 3 minutes for warm up  Wall slide 10 x 2     Step up with march 2x10 8in    Eccentric heel tap 2x10 each 4in Lateral Walk GTB at ankles , at counter x 6 lengths  Standing on 4 inch step with opp hip abduction/extension 10 x 2 each  STS with GTB at knees  x 10 Squat tap to table with GTB at knees Bridge with GTB 10 x 2  Figure 4 stretch bilateral  Piriformis stretch SLR 2# 10 x 2 , one set with ER  QS 5 sec x 10 each Side hip circles x 10 each CW /CCW  PATIENT EDUCATION:  Education details: eval findings, FOTO, HEP, POC Person educated: Patient Education method: Explanation, Demonstration, and Handouts Education comprehension: verbalized understanding and returned demonstration  HOME EXERCISE PROGRAM: Access Code: [email or web address] URL: [email or web address] Date: 12/04/2022 Prepared by: Edwinna Areola  Exercises - Supine Quadricep Sets  - 1 x daily - 5 x weekly - 2 sets - 10 reps - 5 sec hold - Active Straight Leg Raise with Quad Set  - 1 x daily - 5 x weekly - 3 sets - 15 reps - Sidelying Hip Abduction  - 1 x daily - 5 x weekly - 2 sets - 15 reps - Clamshell with Resistance  - 1 x daily - 5 x weekly - 3 sets - 10 reps - blue band hold - Supine Bridge with Resistance Band  - 1 x daily - 5 x weekly - 3 sets - 15 reps - blue band hold - Forward Step Down Touch with Heel  - 1 x daily - 5 x weekly - 2 sets - 10 reps - Side Stepping with Resistance at Ankles and Counter Support  - 1 x daily - 5 x weekly - 2-3 reps - red band hold - Standing Hip Abduction with Resistance at Ankles and Counter Support  - 1 x daily - 5 x weekly - 3 sets - 15 reps - red band hold - Staggered Sit-to-Stand  - 1 x daily - 5 x weekly - 3  sets - 10 reps - Wall Squat  - 1 x daily - 5 x weekly - 2-3 sets - 10 reps - Tandem Stance  - 1 x daily - 7 x weekly - 2 reps - 30 sec hold - Single Leg Stance  - 1 x daily - 7 x weekly - 2 reps - 30 sec hold  ASSESSMENT:  CLINICAL IMPRESSION: Pt was able to complete all prescribed exercises and demonstrated knowledge of HEP with no adverse effect. Over the course of PT treatment pt has progressed very well demonstrating increased quad and proximal hip strength along with decrease in R knee pain. Shows subjective functional improvement with increased FOTO score. Pt should continue to improve with HEP compliance and no longer requires skilled PT services. Pt in agreement with current plan and is ready to discharge at this time.   EVAL: Patient is a 62 y.o. F who was seen today for physical therapy evaluation and treatment for chronic R knee pain. Physical findings are consistent with physician impression  as she demonstrates decrease in LE strength and s/s consistent with PFPS. FOTO score shows subjective decrease in functional ability below PLOF. Pt would benefit from skilled PT working on LE strengthening in order to decrease knee pain.    OBJECTIVE IMPAIRMENTS: decreased activity tolerance, decreased mobility, decreased strength, and pain   ACTIVITY LIMITATIONS: squatting, stairs, and running  PARTICIPATION LIMITATIONS: community activity, yard work, and jogging  PERSONAL FACTORS: Time since onset of injury/illness/exacerbation are also affecting patient's functional outcome.   REHAB POTENTIAL: Excellent  CLINICAL DECISION MAKING: Stable/uncomplicated  EVALUATION COMPLEXITY: Low   GOALS: Goals reviewed with patient? No  SHORT TERM GOALS: Target date: 10/26/2022   Pt will be compliant and knowledgeable with initial HEP for improved comfort and carryover Baseline: initial HEP given  Goal status: MET  2.  Pt will self report right knee pain no greater than 4/10 for improved comfort  and functional ability Baseline: 7/10 at worst 11/20/22: 3/10 at worst  Goal status: MET   LONG TERM GOALS: Target date: 11/30/2022   Pt will improve FOTO function score to no less than 76% as proxy for functional improvement Baseline: 65% function 12/04/2022: 84% function Goal status: MET   2.  Pt will self report right knee pain no greater than 1-2/10 for improved comfort and functional ability Baseline: 7/10 at worst 12/04/2022: 2/10 at worst Goal status: MET   3.  Pt will increase R LE MMT to no less than 5/5 for all tested motions for improved stability and decreased R knee pain Baseline: see MMT chart Goal status: MET  4.  Pt will be able to get back to jogging 3x/wk without increase in R knee pain in order to improve recreational activity tolerance Baseline: unable Goal status: MET    PLAN:  PT FREQUENCY: 1x/week  PT DURATION: 8 weeks  PLANNED INTERVENTIONS: Therapeutic exercises, Therapeutic activity, Neuromuscular re-education, Balance training, Gait training, Patient/Family education, Self Care, Joint mobilization, Dry Needling, Electrical stimulation, Cryotherapy, Moist heat, Manual therapy, and Re-evaluation  PLAN FOR NEXT SESSION:Discharge next visit.    Eloy End PT  12/04/22 9:31 AM

## 2023-02-25 ENCOUNTER — Other Ambulatory Visit: Payer: Self-pay | Admitting: Family Medicine

## 2023-09-24 ENCOUNTER — Ambulatory Visit: Admitting: Family Medicine

## 2023-09-24 ENCOUNTER — Encounter: Payer: Self-pay | Admitting: Family Medicine

## 2023-09-24 ENCOUNTER — Ambulatory Visit: Payer: Self-pay

## 2023-09-24 VITALS — BP 114/72 | HR 79 | Temp 98.2°F | Wt 120.6 lb

## 2023-09-24 DIAGNOSIS — R300 Dysuria: Secondary | ICD-10-CM

## 2023-09-24 LAB — POC URINALSYSI DIPSTICK (AUTOMATED)
Bilirubin, UA: NEGATIVE
Blood, UA: POSITIVE
Glucose, UA: NEGATIVE
Ketones, UA: NEGATIVE
Nitrite, UA: NEGATIVE
Protein, UA: NEGATIVE
Spec Grav, UA: <=1.005 — AB (ref 1.010–1.025)
Urobilinogen, UA: 0.2 U/dL
pH, UA: 7.5 (ref 5.0–8.0)

## 2023-09-24 MED ORDER — CEPHALEXIN 500 MG PO CAPS: 500.0000 mg | ORAL_CAPSULE | Freq: Three times a day (TID) | ORAL | 0 refills | Status: DC

## 2023-09-24 NOTE — Telephone Encounter (Signed)
 FYI Only or Action Required?: FYI only for provider.  Patient was last seen in primary care on 09/24/2022 by Johnny Garnette LABOR, MD.  Called Nurse Triage reporting Urinary Frequency.  Symptoms began yesterday.  Interventions attempted: Rest, hydration, or home remedies.  Symptoms are: unchanged.  Triage Disposition: See Physician Within 24 Hours  Patient/caregiver understands and will follow disposition?: Yes, will follow disposition  Copied from CRM #[id number]. Topic: Clinical - Red Word Triage >> Sep 24, 2023  8:35 AM Revonda D wrote: Red Word that prompted transfer to Nurse Triage: Pain with urination  Pt stated that she thinks she has a UTI due to cloudy urine, frequency, and pain when urinating. Pt stated that she leaves to go out of town today and would like to get an appt with a provider as soon as possible. Reason for Disposition  Urinating more frequently than usual (i.e., frequency) OR new-onset of the feeling of an urgent need to urinate (i.e., urgency)  Answer Assessment - Initial Assessment Questions 1. SYMPTOM: What's the main symptom you're concerned about? (e.g., frequency, incontinence)     Cloudy urination, increased frequency, pain with urination 2. ONSET: When did the  frequency, pain  start?     yesterday 3. PAIN: Is there any pain? If Yes, ask: How bad is it? (Scale: 1-10; mild, moderate, severe)     Moderate  when urinating 4. CAUSE: What do you think is causing the symptoms?     UTI 5. OTHER SYMPTOMS: Do you have any other symptoms? (e.g., blood in urine, fever, flank pain, pain with urination)     Pelvic pain when urinating  Pt requesting to be seen today as she is leaving for vacation today. Scheduled today.  Protocols used: Urinary Symptoms-A-AH

## 2023-09-24 NOTE — Progress Notes (Signed)
 Established Patient Office Visit  Subjective   Patient ID: Jordan Olson, female    DOB: March 12, 1960  Age: 63 y.o. MRN: [id number]  Chief Complaint  Patient presents with   Dysuria    HPI   Jordan Olson is seen with 2-day history of some urine frequency and burning with urination.  Denies any flank pain, gross hematuria, fever, chills, nausea, or vomiting.  No recent UTI.  Generally stays well-hydrated.  No known drug allergies.  She is very fitness minded and runs regularly.  No recent bladder instrumentation.  Past Medical History:  Diagnosis Date   Anal abscess    Chronic constipation    Family history of adverse reaction to anesthesia    per pt her mother, mother's brother and sister have memory issues   Hemorrhoids    History of Lyme disease 2006   treated   (12-25-2021  per pt no residual's)   Migraines    Wears glasses    Past Surgical History:  Procedure Laterality Date   APPENDECTOMY  1990   BLEPHAROPLASTY W/ LASER Bilateral 11/2012   upper eyelid   BREAST BIOPSY Bilateral    Right 2011 Left 2014   COLONOSCOPY  08/2017   per Dr. Sheppard Fitz, internal hemorrhoids, no polyps, repeat in 10 yrs    FISTULOTOMY N/A 03/16/2022   Procedure: FISTULOTOMY;  Surgeon: Debby Hila, MD;  Location: [facility];  Service: General;  Laterality: N/A;   RECTAL EXAM UNDER ANESTHESIA N/A 12/28/2021   Procedure: ANAL EXAM UNDER ANESTHESIA, EXCISION OF ANAL CANAL MASS, DEBRIDEMENT OF ANAL CANAL ABSCESS;  Surgeon: Debby Hila, MD;  Location: [facility];  Service: General;  Laterality: N/A;  MAC   TONSILLECTOMY     age 63    reports that she has never smoked. She has never used smokeless tobacco. She reports current alcohol use of about 7.0 standard drinks of alcohol per week. She reports that she does not use drugs. family history includes Breast cancer (age of onset: 59) in her cousin; Colon cancer in her maternal grandmother; Diabetes in her paternal  grandmother. No Known Allergies  Review of Systems  Constitutional:  Negative for chills and fever.  Genitourinary:  Positive for dysuria and frequency. Negative for flank pain and hematuria.      Objective:     BP 114/72   Pulse 79   Temp 98.2 F (36.8 C) (Oral)   Wt 120 lb 9.6 oz (54.7 kg)   LMP 04/14/2010   SpO2 97%   BMI 18.34 kg/m  BP Readings from Last 3 Encounters:  09/24/23 114/72  09/24/22 108/74  03/16/22 122/87   Wt Readings from Last 3 Encounters:  09/24/23 120 lb 9.6 oz (54.7 kg)  09/24/22 121 lb 14.4 oz (55.3 kg)  03/16/22 123 lb 12.8 oz (56.2 kg)      Physical Exam Vitals reviewed.  Constitutional:      General: She is not in acute distress.    Appearance: She is not ill-appearing or toxic-appearing.  Cardiovascular:     Rate and Rhythm: Normal rate and regular rhythm.  Pulmonary:     Effort: Pulmonary effort is normal.     Breath sounds: Normal breath sounds.  Neurological:     Mental Status: She is alert.      Results for orders placed or performed in visit on 09/24/23  POCT Urinalysis Dipstick (Automated)  Result Value Ref Range   Color, UA Yellow    Clarity, UA  Clear    Glucose, UA Negative Negative   Bilirubin, UA Negative    Ketones, UA Negative    Spec Grav, UA <=1.005 (A) 1.010 - 1.025   Blood, UA Positive    pH, UA 7.5 5.0 - 8.0   Protein, UA Negative Negative   Urobilinogen, UA 0.2 0.2 or 1.0 E.U./dL   Nitrite, UA Negative    Leukocytes, UA Moderate (2+) (A) Negative      The ASCVD Risk score (Arnett DK, et al., 2019) failed to calculate for the following reasons:   The valid HDL cholesterol range is 20 to 100 mg/dL    Assessment & Plan:   Problem List Items Addressed This Visit   None Visit Diagnoses       Dysuria    -  Primary   Relevant Orders   POCT Urinalysis Dipstick (Automated) (Completed)   Urine Culture     2-day history of dysuria.  Urine dipstick suggests probable uncomplicated cystitis as above.   Send urine culture.  Stay well-hydrated.  Start Keflex  500 mg 3 times daily for 5 days pending culture result.  No follow-ups on file.    Wolm Scarlet, MD

## 2023-09-24 NOTE — Telephone Encounter (Signed)
 Pt was seen by Dr Micheal today at the office

## 2023-09-26 LAB — URINE CULTURE
MICRO NUMBER:: 16884346
SPECIMEN QUALITY:: ADEQUATE

## 2023-09-27 ENCOUNTER — Ambulatory Visit: Payer: Self-pay | Admitting: Family Medicine

## 2023-10-03 ENCOUNTER — Ambulatory Visit (INDEPENDENT_AMBULATORY_CARE_PROVIDER_SITE_OTHER): Admitting: Family Medicine

## 2023-10-03 ENCOUNTER — Encounter: Payer: Self-pay | Admitting: Family Medicine

## 2023-10-03 VITALS — BP 102/64 | HR 58 | Temp 98.0°F | Ht 67.5 in | Wt 119.8 lb

## 2023-10-03 DIAGNOSIS — Z0000 Encounter for general adult medical examination without abnormal findings: Secondary | ICD-10-CM

## 2023-10-03 MED ORDER — BUTALBITAL-APAP-CAFF-COD 50-325-40-30 MG PO CAPS: 1.0000 | ORAL_CAPSULE | ORAL | 1 refills | Status: AC | PRN

## 2023-10-03 NOTE — Progress Notes (Signed)
 Subjective:    Patient ID: Jordan Olson, female    DOB: [birth year], 63 y.o.   MRN: [id number]  HPI Here for a well exam. She has no complaints. She asks if she can stop taking the Zetia  since her HDL is always so high. She also asks to check a lipoprotein A level.    Review of Systems  Constitutional: Negative.   HENT: Negative.    Eyes: Negative.   Respiratory: Negative.    Cardiovascular: Negative.   Gastrointestinal: Negative.   Genitourinary:  Negative for decreased urine volume, difficulty urinating, dyspareunia, dysuria, enuresis, flank pain, frequency, hematuria, pelvic pain and urgency.  Musculoskeletal: Negative.   Skin: Negative.   Neurological: Negative.  Negative for headaches.  Psychiatric/Behavioral: Negative.         Objective:   Physical Exam Constitutional:      General: She is not in acute distress.    Appearance: Normal appearance. She is well-developed.  HENT:     Head: Normocephalic and atraumatic.     Right Ear: External ear normal.     Left Ear: External ear normal.     Nose: Nose normal.     Mouth/Throat:     Pharynx: No oropharyngeal exudate.  Eyes:     General: No scleral icterus.    Conjunctiva/sclera: Conjunctivae normal.     Pupils: Pupils are equal, round, and reactive to light.  Neck:     Thyroid : No thyromegaly.     Vascular: No JVD.  Cardiovascular:     Rate and Rhythm: Normal rate and regular rhythm.     Pulses: Normal pulses.     Heart sounds: Normal heart sounds. No murmur heard.    No friction rub. No gallop.  Pulmonary:     Effort: Pulmonary effort is normal. No respiratory distress.     Breath sounds: Normal breath sounds. No wheezing or rales.  Chest:     Chest wall: No tenderness.  Abdominal:     General: Bowel sounds are normal. There is no distension.     Palpations: Abdomen is soft. There is no mass.     Tenderness: There is no abdominal tenderness. There is no guarding or rebound.  Musculoskeletal:         General: No tenderness. Normal range of motion.     Cervical back: Normal range of motion and neck supple.  Lymphadenopathy:     Cervical: No cervical adenopathy.  Skin:    General: Skin is warm and dry.     Findings: No erythema or rash.  Neurological:     General: No focal deficit present.     Mental Status: She is alert and oriented to person, place, and time.     Cranial Nerves: No cranial nerve deficit.     Motor: No abnormal muscle tone.     Coordination: Coordination normal.     Deep Tendon Reflexes: Reflexes are normal and symmetric. Reflexes normal.  Psychiatric:        Mood and Affect: Mood normal.        Behavior: Behavior normal.        Thought Content: Thought content normal.        Judgment: Judgment normal.           Assessment & Plan:  Well exam. We discussed diet and exercise. Get fasting labs, including a lipo A. We agreed she can stop taking Zetia . We will check another lipid panel in 6 months.  Garnette Olmsted, MD

## 2023-10-04 ENCOUNTER — Ambulatory Visit: Payer: Self-pay | Admitting: Family Medicine

## 2023-10-08 LAB — BASIC METABOLIC PANEL WITH GFR
BUN: 22 mg/dL (ref 7–25)
CO2: 29 mmol/L (ref 20–32)
Calcium: 9.8 mg/dL (ref 8.6–10.4)
Chloride: 104 mmol/L (ref 98–110)
Creat: 1.05 mg/dL (ref 0.50–1.05)
Glucose, Bld: 92 mg/dL (ref 65–99)
Potassium: 4.3 mmol/L (ref 3.5–5.3)
Sodium: 142 mmol/L (ref 135–146)
eGFR: 60 mL/min/1.73m2 (ref >=60)

## 2023-10-08 LAB — CBC WITH DIFFERENTIAL/PLATELET
Absolute Lymphocytes: 1020 {cells}/uL (ref 850–3900)
Absolute Monocytes: 313 {cells}/uL (ref 200–950)
Basophils Absolute: 31 {cells}/uL (ref 0–200)
Basophils Relative: 1 %
Eosinophils Absolute: 62 {cells}/uL (ref 15–500)
Eosinophils Relative: 2 %
HCT: 42 % (ref 35.0–45.0)
Hemoglobin: 13.6 g/dL (ref 11.7–15.5)
MCH: 29.8 pg (ref 27.0–33.0)
MCHC: 32.4 g/dL (ref 32.0–36.0)
MCV: 92.1 fL (ref 80.0–100.0)
MPV: 13.9 fL — ABNORMAL HIGH (ref 7.5–12.5)
Monocytes Relative: 10.1 %
Neutro Abs: 1674 {cells}/uL (ref 1500–7800)
Neutrophils Relative %: 54 %
Platelets: 141 Thousand/uL (ref 140–400)
RBC: 4.56 Million/uL (ref 3.80–5.10)
RDW: 12.1 % (ref 11.0–15.0)
Total Lymphocyte: 32.9 %
WBC: 3.1 Thousand/uL — ABNORMAL LOW (ref 3.8–10.8)

## 2023-10-08 LAB — HEPATIC FUNCTION PANEL
AG Ratio: 2.5 (calc) (ref 1.0–2.5)
ALT: 14 U/L (ref 6–29)
AST: 15 U/L (ref 10–35)
Albumin: 4.7 g/dL (ref 3.6–5.1)
Alkaline phosphatase (APISO): 73 U/L (ref 37–153)
Bilirubin, Direct: 0.1 mg/dL (ref 0.0–0.2)
Globulin: 1.9 g/dL (ref 1.9–3.7)
Indirect Bilirubin: 0.4 mg/dL (ref 0.2–1.2)
Total Bilirubin: 0.5 mg/dL (ref 0.2–1.2)
Total Protein: 6.6 g/dL (ref 6.1–8.1)

## 2023-10-08 LAB — HEMOGLOBIN A1C
Hgb A1c MFr Bld: 5.5 % (ref <5.7)
Mean Plasma Glucose: 111 mg/dL
eAG (mmol/L): 6.2 mmol/L

## 2023-10-08 LAB — LIPID PANEL
Cholesterol: 233 mg/dL — ABNORMAL HIGH (ref <200)
HDL: 108 mg/dL (ref >=50)
LDL Cholesterol (Calc): 109 mg/dL — ABNORMAL HIGH
Non-HDL Cholesterol (Calc): 125 mg/dL (ref <130)
Total CHOL/HDL Ratio: 2.2 (calc) (ref <5.0)
Triglycerides: 73 mg/dL (ref <150)

## 2023-10-08 LAB — TSH: TSH: 3.04 m[IU]/L (ref 0.40–4.50)

## 2023-10-08 LAB — LIPOPROTEIN A (LPA): Lipoprotein (a): <10 nmol/L (ref <75)

## 2023-10-09 ENCOUNTER — Telehealth: Payer: Self-pay

## 2023-10-09 ENCOUNTER — Other Ambulatory Visit (HOSPITAL_COMMUNITY): Payer: Self-pay

## 2023-10-09 NOTE — Telephone Encounter (Signed)
 Pharmacy Patient Advocate Encounter   Received notification from Onbase that prior authorization for Fioricet 50-325-40 caps is required/requested.   Insurance verification completed.   The patient is insured through [employer] . This formulation comes in tabs.   Per test claim: The current 30 day co-pay is, $16.83.  No PA needed at this time. This test claim was processed through [facility]- copay amounts may vary at other pharmacies due to pharmacy/plan contracts, or as the patient moves through the different stages of their insurance plan.

## 2023-10-09 NOTE — Telephone Encounter (Signed)
 Pharmacy Patient Advocate Encounter  Received notification from [facility] that Prior Authorization for Butalbital -APAP-Caff-Cod 50-325-40-30MG  capsules  has been APPROVED from 10/09/23 to 10/08/24. Unable to obtain price due to refill too soon rejection, last fill date 10/09/23 next available fill date09/08/25   PA #/Case ID/Reference #: [id number]

## 2023-10-09 NOTE — Telephone Encounter (Signed)
 Pharmacy Patient Advocate Encounter   Received notification from Onbase that prior authorization for Butalbital -APAP-Caff-Cod 50-325-40-30MG  capsules  is required/requested.   Insurance verification completed.   The patient is insured through [employer] .   Per test claim: PA required; PA submitted to above mentioned insurance via Latent Key/confirmation #/EOC [facility] Status is pending

## 2023-10-31 ENCOUNTER — Telehealth: Payer: Self-pay

## 2023-10-31 DIAGNOSIS — Z1239 Encounter for other screening for malignant neoplasm of breast: Secondary | ICD-10-CM

## 2023-10-31 NOTE — Telephone Encounter (Signed)
 Copied from CRM #[id number]. Topic: Clinical - Medical Advice >> Oct 29, 2023 10:57 AM Burnard DEL wrote: Reason for CRM: Patients daughter has just been diagnosed with breast cancer.She was advised by her daughters doctor that she should start getting more in dept testing than just a mammogram. She stated that she is due for her mammogram in October ,she would like to know if she could have orders for an US  as well with the mammogram?

## 2023-11-01 NOTE — Telephone Encounter (Signed)
 I placed orders for 3D mammograms and US 

## 2023-11-01 NOTE — Addendum Note (Signed)
 Addended by: JOHNNY SENIOR A on: 11/01/2023 07:53 AM   Modules accepted: Orders

## 2023-11-01 NOTE — Telephone Encounter (Signed)
 Left detailed message for pt regarding Dr Johnny orders

## 2023-11-19 ENCOUNTER — Other Ambulatory Visit: Payer: Self-pay | Admitting: Medical Genetics

## 2023-11-26 ENCOUNTER — Ambulatory Visit
Admission: RE | Admit: 2023-11-26 | Discharge: 2023-11-26 | Disposition: A | Discharge status: Home / Self Care | Source: Ambulatory Visit | Attending: Family Medicine | Admitting: Family Medicine

## 2023-11-26 DIAGNOSIS — Z1239 Encounter for other screening for malignant neoplasm of breast: Secondary | ICD-10-CM

## 2023-12-12 ENCOUNTER — Other Ambulatory Visit

## 2024-01-22 ENCOUNTER — Other Ambulatory Visit
